# Patient Record
Sex: Female | Born: 1935 | Race: White | Hispanic: No | Smoking: Never smoker
Health system: Southern US, Community
[De-identification: ages and names within clinical notes are randomized; demographics above are authoritative.]

## PROBLEM LIST (undated history)

## (undated) DIAGNOSIS — F0390 Unspecified dementia without behavioral disturbance: Secondary | ICD-10-CM

## (undated) DIAGNOSIS — G20 Parkinson's disease: Secondary | ICD-10-CM

## (undated) DIAGNOSIS — G20A1 Parkinson's disease without dyskinesia, without mention of fluctuations: Secondary | ICD-10-CM

## (undated) DIAGNOSIS — F0280 Dementia in other diseases classified elsewhere without behavioral disturbance: Secondary | ICD-10-CM

## (undated) DIAGNOSIS — I350 Nonrheumatic aortic (valve) stenosis: Secondary | ICD-10-CM

## (undated) DIAGNOSIS — M1990 Unspecified osteoarthritis, unspecified site: Secondary | ICD-10-CM

## (undated) DIAGNOSIS — J9601 Acute respiratory failure with hypoxia: Secondary | ICD-10-CM

## (undated) DIAGNOSIS — E119 Type 2 diabetes mellitus without complications: Secondary | ICD-10-CM

## (undated) DIAGNOSIS — T8859XA Other complications of anesthesia, initial encounter: Secondary | ICD-10-CM

## (undated) DIAGNOSIS — T4145XA Adverse effect of unspecified anesthetic, initial encounter: Secondary | ICD-10-CM

## (undated) DIAGNOSIS — G309 Alzheimer's disease, unspecified: Secondary | ICD-10-CM

## (undated) DIAGNOSIS — R011 Cardiac murmur, unspecified: Secondary | ICD-10-CM

## (undated) DIAGNOSIS — C801 Malignant (primary) neoplasm, unspecified: Secondary | ICD-10-CM

## (undated) HISTORY — PX: EYE SURGERY: SHX253

## (undated) HISTORY — PX: ABDOMINAL HYSTERECTOMY: SHX81

## (undated) HISTORY — PX: FEMORAL ARTERY STENT: SHX1583

## (undated) HISTORY — PX: MASTECTOMY: SHX3

---

## 2011-01-02 ENCOUNTER — Emergency Department (HOSPITAL_COMMUNITY): Payer: Medicare (Managed Care)

## 2011-01-02 ENCOUNTER — Emergency Department (HOSPITAL_COMMUNITY)
Admission: EM | Admit: 2011-01-02 | Discharge: 2011-01-02 | Disposition: A | Discharge status: Home / Self Care | Payer: Medicare (Managed Care) | Attending: Emergency Medicine | Admitting: Emergency Medicine

## 2011-01-02 DIAGNOSIS — M549 Dorsalgia, unspecified: Secondary | ICD-10-CM | POA: Insufficient documentation

## 2011-01-02 DIAGNOSIS — F0280 Dementia in other diseases classified elsewhere without behavioral disturbance: Secondary | ICD-10-CM | POA: Insufficient documentation

## 2011-01-02 DIAGNOSIS — N390 Urinary tract infection, site not specified: Secondary | ICD-10-CM | POA: Insufficient documentation

## 2011-01-02 DIAGNOSIS — I359 Nonrheumatic aortic valve disorder, unspecified: Secondary | ICD-10-CM | POA: Insufficient documentation

## 2011-01-02 DIAGNOSIS — E119 Type 2 diabetes mellitus without complications: Secondary | ICD-10-CM | POA: Insufficient documentation

## 2011-01-02 DIAGNOSIS — R109 Unspecified abdominal pain: Secondary | ICD-10-CM | POA: Insufficient documentation

## 2011-01-02 DIAGNOSIS — G309 Alzheimer's disease, unspecified: Secondary | ICD-10-CM | POA: Insufficient documentation

## 2011-01-02 DIAGNOSIS — R197 Diarrhea, unspecified: Secondary | ICD-10-CM | POA: Insufficient documentation

## 2011-01-02 LAB — GLUCOSE, CAPILLARY: Glucose-Capillary: 197 mg/dL — ABNORMAL HIGH (ref 70–99)

## 2011-01-02 LAB — COMPREHENSIVE METABOLIC PANEL
ALT: 10 U/L (ref 0–35)
Calcium: 8.3 mg/dL — ABNORMAL LOW (ref 8.4–10.5)
GFR calc Af Amer: >60 mL/min (ref >60)
Glucose, Bld: 223 mg/dL — ABNORMAL HIGH (ref 70–99)
Sodium: 137 mEq/L (ref 135–145)
Total Protein: 5.2 g/dL — ABNORMAL LOW (ref 6.0–8.3)

## 2011-01-02 LAB — DIFFERENTIAL
Basophils Absolute: 0 10*3/uL (ref 0.0–0.1)
Eosinophils Absolute: 0 10*3/uL (ref 0.0–0.7)
Lymphocytes Relative: 30 % (ref 12–46)
Neutrophils Relative %: 49 % (ref 43–77)

## 2011-01-02 LAB — URINE MICROSCOPIC-ADD ON

## 2011-01-02 LAB — URINALYSIS, ROUTINE W REFLEX MICROSCOPIC
Nitrite: POSITIVE — AB
Specific Gravity, Urine: 1.031 — ABNORMAL HIGH (ref 1.005–1.030)
Urobilinogen, UA: 2 mg/dL — ABNORMAL HIGH (ref 0.0–1.0)

## 2011-01-02 LAB — LIPASE, BLOOD: Lipase: 23 U/L (ref 11–59)

## 2011-01-02 LAB — POCT I-STAT TROPONIN I: Troponin i, poc: 0.02 ng/mL (ref 0.00–0.08)

## 2011-01-02 LAB — CBC
MCH: 27.6 pg (ref 26.0–34.0)
Platelets: 106 10*3/uL — ABNORMAL LOW (ref 150–400)
RBC: 4.45 MIL/uL (ref 3.87–5.11)
WBC: 9 10*3/uL (ref 4.0–10.5)

## 2011-07-30 ENCOUNTER — Ambulatory Visit (INDEPENDENT_AMBULATORY_CARE_PROVIDER_SITE_OTHER): Payer: Medicare Other | Admitting: Family Medicine

## 2011-07-30 ENCOUNTER — Ambulatory Visit: Payer: Medicare Other

## 2011-07-30 VITALS — BP 111/68 | HR 77 | Temp 98.9°F | Resp 16

## 2011-07-30 DIAGNOSIS — W19XXXA Unspecified fall, initial encounter: Secondary | ICD-10-CM

## 2011-07-30 DIAGNOSIS — M25579 Pain in unspecified ankle and joints of unspecified foot: Secondary | ICD-10-CM

## 2011-07-30 DIAGNOSIS — M79673 Pain in unspecified foot: Secondary | ICD-10-CM

## 2011-07-30 DIAGNOSIS — M79609 Pain in unspecified limb: Secondary | ICD-10-CM

## 2011-07-30 NOTE — Progress Notes (Signed)
Patient Name: Emily Gibbs Date of Birth: 1931-11-21 Medical Record Number: [id number] Gender: female Date of Encounter: 07/30/2011  History of Present Illness:  Emily Gibbs is a 78 y.o. very pleasant female patient who presents with the following:  Here today to evaluate a possible left ankle or foot fracture. She fell last Thursday (8 days ago) and hurt her right shin- sustained a skin tear.  However more recently her family has noticed that he left anterior ankle looks swollen and bruised, and that her left lateral foot is bruised as well.  They believe she hurt her left foot at the same time she hurt her right  Emily Gibbs has dementia and uses a wheelchair for most mobility.  She does walk a bit for transfers.  Use uses a walker  They are treating her right anterior shin skin tears with silvadene cream and bandaging- she will be seen to follow-up this on monday  Moved from [place]- here today with her granddaughter.  Lives with her daughter- [name]- the mother of [name] who is here with her today.  Her PCP is still in [place]- but they are working on getting her a local PCP.  Normally [name] ambulates with a walker- however she is here today in her own wheelchair.  She also has a hospital bed and lift chair.  She has 24 hour care.    There is no problem list on file for this patient.  No past medical history on file. No past surgical history on file. History  Substance Use Topics  . Smoking status: Never Smoker   . Smokeless tobacco: Current User    Types: Snuff  . Alcohol Use: Not on file   No family history on file. No Known Allergies  Medication list has been reviewed and updated.  Review of Systems: As per HPI- otherwise negative.  Physical Examination: Filed Vitals:   07/30/11 1358  BP: 111/68  Pulse: 77  Temp: 98.9 F (37.2 C)  TempSrc: Oral  Resp: 16    There is no height or weight on file to calculate BMI.   GEN: WDWN, NAD, Non-toxic,  alert, in wheelchair HEENT: Atraumatic, Normocephalic.  RRR, lungs are clear bilaterally Ears and Nose: No external deformity. EXTR: No clubbing/cyanosis/edema NEURO: Normal gait.  PSYCH: Normally interactive. Conversant. Not depressed or anxious appearing.  Calm demeanor.  Left ankle: swelling, bruising and tenderness anteriorly.  There is also brusing and tenderness in the lateral foot.  She has tenderness over the entire foot and ankle- unable to pinpoint area of maximal tenderness. Looked at right anterior shin- skin tears are healing  [facility] reading (PRIMARY) by  Dr. Patsy Lager.  Degenerative change but no fracture noted  Findings:  Diffuse osteopenia. Subtle lucency through the fifth metatarsal head, worrisome for a nondisplaced fracture. Enthesopathic change seen at the base of the 5th metatarsal. Small plantar calcaneal spur. Enthesopathic change of the Achilles tendon insertion site. Vascular calcifications.  IMPRESSION: Lucency through the fifth metatarsal head and worrisome for a nondisplaced fracture. Correlation for point tenderness at this location is recommended.  Above findings discussed with Dr. Patsy Lager at [phone].   ------------------------------------------------------------------------- When compared with the lateral radiograph of the foot performed earlier same day, there is diffuse osteopenia without definite evidence of fracture. Ankle mortise is preserved. There is mild diffuse soft tissue swelling about the ankle. No ankle joint effusion. Mild degenerative change of the tibial talar joint. Enthesopathic change of the Achilles tendon insertion site. Small plantar calcaneal spur. Vascular calcifications.  IMPRESSION: Soft tissue swelling about the ankle without associated fracture.  Please refer to separate dictation from foot radiographs performed earlier same day.   Assessment and Plan: 1. Fall    2. Ankle pain  DG Ankle Complete Left, DG Foot Complete  Left  3. Foot pain  DG Ankle Complete Left, DG Foot Complete Left   [name] may have a foot fracture, but I am very concerned that a walking boot or cast will cause her to fall as she is frail and has poor balance.  Placed in a post- op shoe and encouraged her family to have her use her walker for any transfers and chair for distance.  She will come back and see [place] in about 3 days for a recheck- Sooner if worse.

## 2011-10-19 ENCOUNTER — Ambulatory Visit (HOSPITAL_COMMUNITY): Payer: Medicare Other

## 2012-01-04 ENCOUNTER — Telehealth: Payer: Self-pay

## 2012-01-04 NOTE — Telephone Encounter (Signed)
Would like to pick up copy of ankle xray please call [phone] when ready

## 2012-01-05 ENCOUNTER — Telehealth: Payer: Self-pay

## 2012-01-05 NOTE — Telephone Encounter (Signed)
[name] STATES HER MOM HAD CALLED THE OTHER DAY REQUESTING HER GRANDMOTHER'S XRAYS AND THEY WEREN'T READY, THEY REALLY NEED TO P/U ASAP PLEASE CALL [phone]

## 2012-01-05 NOTE — Telephone Encounter (Signed)
X-rays ready for pick up. Left message on machine. °

## 2012-01-05 NOTE — Telephone Encounter (Signed)
Have given to xray to get ready for patient.

## 2012-09-17 ENCOUNTER — Emergency Department (HOSPITAL_COMMUNITY)
Admission: EM | Admit: 2012-09-17 | Discharge: 2012-09-17 | Disposition: A | Discharge status: Home / Self Care | Payer: Medicare (Managed Care) | Attending: Emergency Medicine | Admitting: Emergency Medicine

## 2012-09-17 ENCOUNTER — Emergency Department (HOSPITAL_COMMUNITY): Payer: Medicare (Managed Care)

## 2012-09-17 DIAGNOSIS — E119 Type 2 diabetes mellitus without complications: Secondary | ICD-10-CM | POA: Insufficient documentation

## 2012-09-17 DIAGNOSIS — W1809XA Striking against other object with subsequent fall, initial encounter: Secondary | ICD-10-CM | POA: Insufficient documentation

## 2012-09-17 DIAGNOSIS — Y9389 Activity, other specified: Secondary | ICD-10-CM | POA: Insufficient documentation

## 2012-09-17 DIAGNOSIS — Y9289 Other specified places as the place of occurrence of the external cause: Secondary | ICD-10-CM | POA: Insufficient documentation

## 2012-09-17 DIAGNOSIS — S81009A Unspecified open wound, unspecified knee, initial encounter: Secondary | ICD-10-CM | POA: Insufficient documentation

## 2012-09-17 DIAGNOSIS — F0390 Unspecified dementia without behavioral disturbance: Secondary | ICD-10-CM | POA: Insufficient documentation

## 2012-09-17 DIAGNOSIS — Z794 Long term (current) use of insulin: Secondary | ICD-10-CM | POA: Insufficient documentation

## 2012-09-17 DIAGNOSIS — M869 Osteomyelitis, unspecified: Secondary | ICD-10-CM | POA: Insufficient documentation

## 2012-09-17 DIAGNOSIS — Z7902 Long term (current) use of antithrombotics/antiplatelets: Secondary | ICD-10-CM | POA: Insufficient documentation

## 2012-09-17 DIAGNOSIS — Z23 Encounter for immunization: Secondary | ICD-10-CM | POA: Insufficient documentation

## 2012-09-17 DIAGNOSIS — Z7982 Long term (current) use of aspirin: Secondary | ICD-10-CM | POA: Insufficient documentation

## 2012-09-17 DIAGNOSIS — S81819A Laceration without foreign body, unspecified lower leg, initial encounter: Secondary | ICD-10-CM

## 2012-09-17 DIAGNOSIS — Z79899 Other long term (current) drug therapy: Secondary | ICD-10-CM | POA: Insufficient documentation

## 2012-09-17 MED ORDER — CLINDAMYCIN HCL 300 MG PO CAPS
300.0000 mg | ORAL_CAPSULE | Freq: Once | ORAL | Status: AC
  Administered 2012-09-17: 300 mg via ORAL
  Filled 2012-09-17: qty 1

## 2012-09-17 MED ORDER — CLINDAMYCIN HCL 300 MG PO CAPS: 300.0000 mg | ORAL_CAPSULE | Freq: Three times a day (TID) | ORAL | Status: DC

## 2012-09-17 MED ORDER — TETANUS-DIPHTH-ACELL PERTUSSIS 5-2.5-18.5 LF-MCG/0.5 IM SUSP
0.5000 mL | Freq: Once | INTRAMUSCULAR | Status: AC
  Administered 2012-09-17: 0.5 mL via INTRAMUSCULAR
  Filled 2012-09-17: qty 0.5

## 2012-09-17 NOTE — ED Provider Notes (Signed)
History     CSN: [id number]  Arrival date & time 09/17/12  1505   First MD Initiated Contact with Patient 09/17/12 [phone]      Chief Complaint  Patient presents with  . Laceration    (Consider location/radiation/quality/duration/timing/severity/associated sxs/prior treatment) The history is provided by a relative. The history is limited by the condition of the patient.  Emily Gibbs is a 79 y.o. female history of dementia, DM here with fall. She just came back from [place] today and was trying to get in the door and her legs gave out and she hit her anterior shins on the steps. There is no head injury since the family was able to catch her in time. She then had some skin tears and was brought in for evaluation. Her tetanus was not up-to-date. History as per granddaughter as she is demented. She is on asa, plavix, but not coumadin.    Level V caveat- dementia   No past medical history on file.  No past surgical history on file.  No family history on file.  History  Substance Use Topics  . Smoking status: Never Smoker   . Smokeless tobacco: Current User    Types: Snuff  . Alcohol Use: Not on file    OB History   Grav Para Term Preterm Abortions TAB SAB Ect Mult Living                  Review of Systems  Unable to perform ROS: Dementia    Allergies  Review of patient's allergies indicates no known allergies.  Home Medications   Current Outpatient Rx  Name  Route  Sig  Dispense  Refill  . aspirin 81 MG tablet   Oral   Take 81 mg by mouth daily.         . citalopram (CELEXA) 10 MG tablet   Oral   Take 10 mg by mouth 3 (three) times daily.         . clopidogrel (PLAVIX) 75 MG tablet   Oral   Take 75 mg by mouth every morning.         . divalproex (DEPAKOTE) 500 MG DR tablet   Oral   Take 500 mg by mouth at bedtime.         . donepezil (ARICEPT) 10 MG tablet   Oral   Take 10 mg by mouth at bedtime.         Marland Kitchen esomeprazole (NEXIUM) 40 MG  capsule   Oral   Take 40 mg by mouth daily before breakfast.         . furosemide (LASIX) 20 MG tablet   Oral   Take 20 mg by mouth daily.         . insulin lispro (HUMALOG) 100 UNIT/ML injection   Subcutaneous   Inject 20 Units into the skin daily before breakfast.         . insulin lispro (HUMALOG) 100 UNIT/ML injection   Subcutaneous   Inject 100 Units into the skin daily before supper.         . memantine (NAMENDA) 10 MG tablet   Oral   Take 10 mg by mouth 2 (two) times daily.         . simvastatin (ZOCOR) 40 MG tablet   Oral   Take 40 mg by mouth every evening.         . traZODone (DESYREL) 150 MG tablet   Oral   Take 150 mg  by mouth at bedtime.         . vitamin E 1000 UNIT capsule   Oral   Take 1,000 Units by mouth daily.           BP 118/51  Temp(Src) 98.7 F (37.1 C) (Oral)  Resp 16  SpO2 93%  Physical Exam  Nursing note and vitals reviewed. Constitutional:  Demented, chronically ill, pleasant, NAD   HENT:  Head: Normocephalic.  Mouth/Throat: Oropharynx is clear and moist.  Eyes: Conjunctivae are normal. Pupils are equal, round, and reactive to light.  Neck: Normal range of motion. Neck supple.  Cardiovascular: Normal rate, regular rhythm and normal heart sounds.   Pulmonary/Chest: Effort normal and breath sounds normal. No respiratory distress. She has no wheezes. She has no rales.  Abdominal: Soft. Bowel sounds are normal. She exhibits no distension. There is no tenderness. There is no rebound and no guarding.  Musculoskeletal: Normal range of motion.  Neurological: She is alert.  Demented, moving all extremities   Skin:     Two skin tears on R tib/fib area with minimal bleeding. One large skin tear on L tib/fib area. Diminished pulses bilaterally. The R big toe appeared necrotic and some surrounding cellulitis.   Psychiatric:  Unable     ED Course  Procedures (including critical care time)  LACERATION REPAIR Performed by:  Chaney Malling Authorized by: Silverio Lay, Shawnda Mauney Consent: Verbal consent obtained. Risks and benefits: risks, benefits and alternatives were discussed Consent given by: patient Patient identity confirmed: provided demographic data Prepped and Draped in normal sterile fashion Wound explored  Laceration Location: bilateral shins  Laceration Length: 30 cm  No Foreign Bodies seen or palpated  Anesthesia: none  Local anesthetic: none  Irrigation method: syringe Amount of cleaning: standard  Skin closure: I debrided the dead skin and applied petroleum and then dry dressing   Patient tolerance: Patient tolerated the procedure well with no immediate complications.   Labs Reviewed - No data to display Dg Tibia/fibula Left  09/17/2012   *RADIOLOGY REPORT*  Clinical Data: Status post fall.  Lower leg lacerations.  LEFT TIBIA AND FIBULA - 2 VIEW  Comparison: Ankle radiographs 07/30/2011.  Findings: The bones are demineralized.  There is no evidence of acute fracture, dislocation or bone destruction.  There is some soft tissue irregularity anteriorly in the distal lower leg.  No foreign body is apparent.  There are diffuse vascular calcifications.  A distal femoral/popliteal stent graft is noted.  IMPRESSION: Lower leg soft tissue laceration without foreign body.  No acute osseous findings.   Original Report Authenticated By: Carey Bullocks, M.D.   Dg Tibia/fibula Right  09/17/2012   *RADIOLOGY REPORT*  Clinical Data: Fall today.  Lower leg lacerations.  RIGHT TIBIA AND FIBULA - 2 VIEW  Comparison: None.  Findings: The bones are demineralized.  There is no evidence of acute fracture, dislocation or bone destruction.  There is soft tissue irregularity anterior to the mid tibia consistent with a laceration.  No foreign body is identified.  Scattered vascular calcifications are noted.  There is a stent graft in the distal femoral artery.  IMPRESSION: Pretibial soft tissue injury.  No acute osseous findings or  evidence of foreign body.   Original Report Authenticated By: Carey Bullocks, M.D.   Dg Toe Great Right  09/17/2012   *RADIOLOGY REPORT*  Clinical Data: Black, necrotic right toe, evaluate for osteomyelitis  RIGHT GREAT TOE  Comparison: None.  Findings: Soft tissue swelling/irregularity overlying the distal first digit.  Cortical irregularity involving the first distal tuft, worrisome for osteomyelitis.  No fracture or dislocation is seen.  Degenerative changes of the first IP joint.  IMPRESSION: Cortical irregularity involving the first distal tuft, worrisome for osteomyelitis.   Original Report Authenticated By: Charline Bills, M.D.     No diagnosis found.    MDM  Emily Gibbs is a 78 y.o. female here with s/p fall with skin tears and R toe cellulitis. Will get xray to r/o foreign body and R toe osteomyelitis. The family knows about the T toe infection for awhile and wants to try and avoid amputation if possible.   5:28 PM Xray showed no foreign bodies. I debrided dead skin and applied petroleum gel and then dressing. Xray R toe showed possible osteo. As per family, she has this for a long time. She is not septic appearing and not hypotensive. They want to try PO abx for now and has podiatry f/u. Will d/c home on clinda. Recommend bactracin on wound and dressing changes. I told the family that the old skin will fall off by itself. Return precautions given.        Richardean Canal, MD 09/17/12 [phone]

## 2012-09-17 NOTE — ED Notes (Signed)
Per ems: pt was coming back from [facility] today with family, while getting into car pt slipped and obtained skin tares to bilat legs. Pt was heavily bleeding for 3 hour car ride. When pt returned home family called EMS. Hx of dementia, is on anticoagulants. bp 130/62, pulse 90, respirations 16, pain 8/10

## 2012-09-17 NOTE — ED Notes (Signed)
MD Yao at bedside 

## 2012-09-17 NOTE — ED Notes (Signed)
[id number]<JY> Expected date:<BR> Expected time:<BR> Means of arrival:<BR> Comments:<BR> Fall, uncontrolled bleeding

## 2012-10-13 ENCOUNTER — Emergency Department (HOSPITAL_COMMUNITY): Payer: Medicare (Managed Care)

## 2012-10-13 ENCOUNTER — Emergency Department (HOSPITAL_COMMUNITY)
Admission: EM | Admit: 2012-10-13 | Discharge: 2012-10-13 | Disposition: A | Discharge status: Home / Self Care | Payer: Medicare (Managed Care) | Attending: Emergency Medicine | Admitting: Emergency Medicine

## 2012-10-13 ENCOUNTER — Encounter (HOSPITAL_BASED_OUTPATIENT_CLINIC_OR_DEPARTMENT_OTHER): Payer: Medicare (Managed Care) | Attending: General Surgery

## 2012-10-13 ENCOUNTER — Encounter (HOSPITAL_COMMUNITY): Payer: Self-pay | Admitting: Family Medicine

## 2012-10-13 DIAGNOSIS — R011 Cardiac murmur, unspecified: Secondary | ICD-10-CM | POA: Insufficient documentation

## 2012-10-13 DIAGNOSIS — E119 Type 2 diabetes mellitus without complications: Secondary | ICD-10-CM | POA: Insufficient documentation

## 2012-10-13 DIAGNOSIS — Z79899 Other long term (current) drug therapy: Secondary | ICD-10-CM | POA: Insufficient documentation

## 2012-10-13 DIAGNOSIS — R5381 Other malaise: Secondary | ICD-10-CM | POA: Insufficient documentation

## 2012-10-13 DIAGNOSIS — Z7902 Long term (current) use of antithrombotics/antiplatelets: Secondary | ICD-10-CM | POA: Insufficient documentation

## 2012-10-13 DIAGNOSIS — F0390 Unspecified dementia without behavioral disturbance: Secondary | ICD-10-CM | POA: Insufficient documentation

## 2012-10-13 DIAGNOSIS — E1159 Type 2 diabetes mellitus with other circulatory complications: Secondary | ICD-10-CM | POA: Insufficient documentation

## 2012-10-13 DIAGNOSIS — F0280 Dementia in other diseases classified elsewhere without behavioral disturbance: Secondary | ICD-10-CM | POA: Insufficient documentation

## 2012-10-13 DIAGNOSIS — G20 Parkinson's disease: Secondary | ICD-10-CM | POA: Insufficient documentation

## 2012-10-13 DIAGNOSIS — Z8673 Personal history of transient ischemic attack (TIA), and cerebral infarction without residual deficits: Secondary | ICD-10-CM | POA: Insufficient documentation

## 2012-10-13 DIAGNOSIS — R4182 Altered mental status, unspecified: Secondary | ICD-10-CM | POA: Insufficient documentation

## 2012-10-13 DIAGNOSIS — M129 Arthropathy, unspecified: Secondary | ICD-10-CM | POA: Insufficient documentation

## 2012-10-13 DIAGNOSIS — E1169 Type 2 diabetes mellitus with other specified complication: Secondary | ICD-10-CM | POA: Insufficient documentation

## 2012-10-13 DIAGNOSIS — R531 Weakness: Secondary | ICD-10-CM

## 2012-10-13 DIAGNOSIS — I10 Essential (primary) hypertension: Secondary | ICD-10-CM | POA: Insufficient documentation

## 2012-10-13 DIAGNOSIS — L97809 Non-pressure chronic ulcer of other part of unspecified lower leg with unspecified severity: Secondary | ICD-10-CM | POA: Insufficient documentation

## 2012-10-13 DIAGNOSIS — G20A1 Parkinson's disease without dyskinesia, without mention of fluctuations: Secondary | ICD-10-CM | POA: Insufficient documentation

## 2012-10-13 DIAGNOSIS — I96 Gangrene, not elsewhere classified: Secondary | ICD-10-CM | POA: Insufficient documentation

## 2012-10-13 DIAGNOSIS — Z7982 Long term (current) use of aspirin: Secondary | ICD-10-CM | POA: Insufficient documentation

## 2012-10-13 DIAGNOSIS — L97509 Non-pressure chronic ulcer of other part of unspecified foot with unspecified severity: Secondary | ICD-10-CM | POA: Insufficient documentation

## 2012-10-13 DIAGNOSIS — Z8679 Personal history of other diseases of the circulatory system: Secondary | ICD-10-CM | POA: Insufficient documentation

## 2012-10-13 DIAGNOSIS — Z794 Long term (current) use of insulin: Secondary | ICD-10-CM | POA: Insufficient documentation

## 2012-10-13 DIAGNOSIS — Z853 Personal history of malignant neoplasm of breast: Secondary | ICD-10-CM | POA: Insufficient documentation

## 2012-10-13 DIAGNOSIS — G309 Alzheimer's disease, unspecified: Secondary | ICD-10-CM | POA: Insufficient documentation

## 2012-10-13 HISTORY — DX: Unspecified osteoarthritis, unspecified site: M19.90

## 2012-10-13 HISTORY — DX: Alzheimer's disease, unspecified: G30.9

## 2012-10-13 HISTORY — DX: Parkinson's disease without dyskinesia, without mention of fluctuations: G20.A1

## 2012-10-13 HISTORY — DX: Cardiac murmur, unspecified: R01.1

## 2012-10-13 HISTORY — DX: Dementia in other diseases classified elsewhere, unspecified severity, without behavioral disturbance, psychotic disturbance, mood disturbance, and anxiety: F02.80

## 2012-10-13 HISTORY — DX: Parkinson's disease: G20

## 2012-10-13 HISTORY — DX: Malignant (primary) neoplasm, unspecified: C80.1

## 2012-10-13 HISTORY — DX: Type 2 diabetes mellitus without complications: E11.9

## 2012-10-13 HISTORY — DX: Nonrheumatic aortic (valve) stenosis: I35.0

## 2012-10-13 HISTORY — DX: Unspecified dementia, unspecified severity, without behavioral disturbance, psychotic disturbance, mood disturbance, and anxiety: F03.90

## 2012-10-13 LAB — CBC
HCT: 33.7 % — ABNORMAL LOW (ref 36.0–46.0)
Platelets: 244 10*3/uL (ref 150–400)
RDW: 16 % — ABNORMAL HIGH (ref 11.5–15.5)
WBC: 9.3 10*3/uL (ref 4.0–10.5)

## 2012-10-13 LAB — COMPREHENSIVE METABOLIC PANEL
ALT: 5 U/L (ref 0–35)
AST: 10 U/L (ref 0–37)
Albumin: 2.5 g/dL — ABNORMAL LOW (ref 3.5–5.2)
Alkaline Phosphatase: 50 U/L (ref 39–117)
Chloride: 98 mEq/L (ref 96–112)
Potassium: 4.1 mEq/L (ref 3.5–5.1)
Sodium: 137 mEq/L (ref 135–145)
Total Bilirubin: 0.2 mg/dL — ABNORMAL LOW (ref 0.3–1.2)

## 2012-10-13 LAB — URINALYSIS, ROUTINE W REFLEX MICROSCOPIC
Bilirubin Urine: NEGATIVE
Hgb urine dipstick: NEGATIVE
Specific Gravity, Urine: 1.023 (ref 1.005–1.030)
pH: 6.5 (ref 5.0–8.0)

## 2012-10-13 MED ORDER — SODIUM CHLORIDE 0.9 % IV SOLN
Freq: Once | INTRAVENOUS | Status: AC
  Administered 2012-10-13: 18:00:00 via INTRAVENOUS

## 2012-10-13 NOTE — Progress Notes (Signed)
   CARE MANAGEMENT ED NOTE 10/13/2012  Patient:  GNFAOZH,[id number]   Account Number:  [id number]  Date Initiated:  10/13/2012  Documentation initiated by:  Radford Pax  Subjective/Objective Assessment:   Patient presented to ED with slurred speech and patient not interacting with family as much as per family.     Subjective/Objective Assessment Detail:     Action/Plan:   Action/Plan Detail:   Anticipated DC Date:  10/13/2012     Status Recommendation to Physician:   Result of Recommendation:    Other ED Services  Consult Working Plan    DC Planning Services  Other    Choice offered to / List presented to:            Status of service:  Completed, signed off  ED Comments:   ED Comments Detail:  EDCM spoke to patient's family at bedside as patient was sleeping.  Patient's grandaughters [name] and Didi at bedside.  Patient lives with patient's daughter [name] and Information systems manager Didi as per [employer].  [facility] was informed that patient already has [facility] which includes an Charity fundraiser and PT.  These services were set up by her pcp in [place].  no further needs at this time.

## 2012-10-13 NOTE — ED Notes (Signed)
Family states the past week pt has seen a lot of doctors and been traveling to [place] d/t injury of falling and hurting legs, family states they thought pt was acting different d/t the traveling and staying busy, the past week pt has been talking less, weaker and had food running out of mouth when trying to eat. Pt shakes head yes or no but not answering questions at this time. Pt not following commands such as lifting legs or smiling, did try and squeeze hands.

## 2012-10-13 NOTE — ED Provider Notes (Addendum)
Medical screening examination/treatment/procedure(s) were conducted as a shared visit with non-physician practitioner(s) and myself.  I personally evaluated the patient during the encounter Pt with some increased weakness, altered MS.  Hx of dementia.  No focal deficits on exam although limited due to her dementia.  Possible subacute stroke on CT.  Doubt admission would improve pt's overall status.  Family wants to take pt home which I think is ok.  Date: 10/14/2012  Rate: 85  Rhythm: normal sinus rhythm  QRS Axis: left  Intervals: normal  ST/T Wave abnormalities: nonspecific ST/T changes  Conduction Disutrbances:none  Narrative Interpretation:   Old EKG Reviewed: none available    Rolan Bucco, MD 10/13/12 1610  Rolan Bucco, MD 10/14/12 0028

## 2012-10-13 NOTE — ED Notes (Signed)
Patient's granddaughter states that on Wednesday she noticed that the patient was drooling a lot. Today, patient was noted to have slurred speech and not interacting with family as much as normal.

## 2012-10-13 NOTE — ED Provider Notes (Signed)
History    CSN: [id number] Arrival date & time 10/13/12  1554  First MD Initiated Contact with Patient 10/13/12 1708     Chief Complaint  Patient presents with  . Altered Mental Status  . Weakness   (Consider location/radiation/quality/duration/timing/severity/associated sxs/prior Treatment) Patient is a 79 y.o. female presenting with altered mental status and weakness. The history is provided by a relative and a caregiver. No language interpreter was used.  Altered Mental Status Presenting symptoms: behavior changes   Severity:  Moderate Associated symptoms comment:  The patient resides at home with daughter and granddaughter as caregivers. They reports that last week she had a choking episode while eating rice. Following this, she has become less verbal, has had drooling episodes and changed mental status from her baseline. She continues to be interested in eating and drinking. The caregivers deny any odor to urine or increased frequency. No vomiting. She has had a minimal cough. There is some belief in her having a fever last week but none since that time.  Weakness   Past Medical History  Diagnosis Date  . Diabetes mellitus without complication   . Arthritis   . Cancer     Breast   . Parkinson disease   . Alzheimer disease   . Dementia   . Aortic stenosis   . Heart murmur    Past Surgical History  Procedure Laterality Date  . Abdominal hysterectomy    . Mastectomy      Left  . Femoral artery stent     No family history on file. History  Substance Use Topics  . Smoking status: Never Smoker   . Smokeless tobacco: Current User    Types: Snuff  . Alcohol Use: No   OB History   Grav Para Term Preterm Abortions TAB SAB Ect Mult Living                 Review of Systems  Unable to perform ROS Psychiatric/Behavioral: Positive for altered mental status.    Allergies  Review of patient's allergies indicates no known allergies.  Home Medications   Current  Outpatient Rx  Name  Route  Sig  Dispense  Refill  . aspirin 81 MG tablet   Oral   Take 81 mg by mouth daily.         . cilostazol (PLETAL) 100 MG tablet   Oral   Take 100 mg by mouth 2 (two) times daily.         . citalopram (CELEXA) 10 MG tablet   Oral   Take 15 mg by mouth at bedtime.          . clopidogrel (PLAVIX) 75 MG tablet   Oral   Take 75 mg by mouth every morning.         . divalproex (DEPAKOTE) 500 MG DR tablet   Oral   Take 500 mg by mouth at bedtime.         . donepezil (ARICEPT) 10 MG tablet   Oral   Take 10 mg by mouth at bedtime.         Marland Kitchen esomeprazole (NEXIUM) 40 MG capsule   Oral   Take 40 mg by mouth daily before breakfast.         . fish oil-omega-3 fatty acids 1000 MG capsule   Oral   Take 2 g by mouth every morning.         . furosemide (LASIX) 20 MG tablet   Oral  Take 20 mg by mouth daily.         . insulin lispro (HUMALOG) 100 UNIT/ML injection   Subcutaneous   Inject 20 Units into the skin daily before breakfast.         . memantine (NAMENDA) 10 MG tablet   Oral   Take 10 mg by mouth 2 (two) times daily.         Marland Kitchen OVER THE COUNTER MEDICATION   Oral   Take 0.5 tablets by mouth daily as needed (allergy). Over the counter store brand ( [facility]) allergy medication         . simvastatin (ZOCOR) 40 MG tablet   Oral   Take 40 mg by mouth every evening.         . traZODone (DESYREL) 100 MG tablet   Oral   Take 150 mg by mouth at bedtime.         . vitamin E 1000 UNIT capsule   Oral   Take 1,000 Units by mouth daily.          BP 137/56  Pulse 92  Temp(Src) 98.5 F (36.9 C) (Oral)  Resp 18  Ht 5\' 2"  (1.575 m)  Wt 156 lb (70.761 kg)  BMI 28.53 kg/m2  SpO2 95% Physical Exam  Constitutional: She appears well-developed and well-nourished. No distress.  HENT:  Head: Normocephalic.  Mouth/Throat: Mucous membranes are dry.  Eyes: Conjunctivae are normal.  Chronic pupil deformity left eye secondary  to cataract surgery.  Cardiovascular: Normal rate and regular rhythm.   Murmur heard. Pulmonary/Chest: Effort normal. She has no wheezes. She has no rales.  Abdominal: Soft. She exhibits no mass. There is no tenderness.  Musculoskeletal: She exhibits no edema.  Neurological: She is alert.  Tracking movement. Follows some commands. Nonverbal. Remainder neurologic exam unable to assess.  Skin: Skin is warm and dry.    ED Course  Procedures (including critical care time) Labs Reviewed  GLUCOSE, CAPILLARY - Abnormal; Notable for the following:    Glucose-Capillary 303 (*)    All other components within normal limits  CBC - Abnormal; Notable for the following:    Hemoglobin 10.3 (*)    HCT 33.7 (*)    MCV 73.3 (*)    MCH 22.4 (*)    RDW 16.0 (*)    All other components within normal limits  COMPREHENSIVE METABOLIC PANEL - Abnormal; Notable for the following:    Glucose, Bld 300 (*)    Creatinine, Ser 0.49 (*)    Albumin 2.5 (*)    Total Bilirubin 0.2 (*)    All other components within normal limits  URINE CULTURE  PROTIME-INR  URINALYSIS, ROUTINE W REFLEX MICROSCOPIC   No results found. No diagnosis found. 1. Weakness 2. Dementia   MDM  Condition unchanged in ED. Labs reassuring, CT without acute change. Discussed results with family, all questions answered. They state they would prefer taking her home as opposed to admission and feel she is "just worn out". Discussed symptoms that would prompt return to ED.  Arnoldo Hooker, PA-C 10/13/12 2025

## 2012-10-14 LAB — URINE CULTURE: Colony Count: >=100000

## 2012-10-15 ENCOUNTER — Telehealth (HOSPITAL_COMMUNITY): Payer: Self-pay | Admitting: Emergency Medicine

## 2012-10-15 NOTE — ED Notes (Signed)
Post ED Visit - Positive Culture Follow-up  Culture report reviewed by antimicrobial stewardship pharmacist: []  Wes Dulaney, Pharm.D., BCPS []  Celedonio Miyamoto, Pharm.D., BCPS []  Georgina Pillion, [address].D., BCPS []  [place], [address].D., BCPS, AAHIVP []  Estella Husk, Pharm.D., BCPS, AAHIVP [x]  Okey Regal, Pharm.D., BCPS  Positive urine culture Likely contaminant, no treatment needed and no further patient follow-up is required at this time.  Kylie A Holland 10/15/2012, 5:54 PM

## 2012-10-16 NOTE — Progress Notes (Signed)
[facility]  NAME:  [name], [name] NO.:  MEDICAL RECORD NO.:  [id number]      DATE OF BIRTH:  19-Feb-1932  PHYSICIAN:  Ardath Sax, M.D.           VISIT DATE:                                  OFFICE VISIT   Emily Gibbs is a 79 year old lady who was sent to [place] because of a gangrenous right great toe, with also I am going to say Wagner 2 and 3 ulcers on the right leg.  This lady is senile but she does live at home with her family, is able to take care of her.  She is a diabetic, has hypertension.  She is on many medicines including Lasix and trazodone, Ativan, Plavix 75 mg a day, Pletal 100 mg a day, and they have recently put her on Cipro for her toe.  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  She has type 2 diabetes and peripheral vascular disease.  On exam today, she has a blood pressure of 134/76, respirations 18, pulse 100, temperature 98.  She weighs 155 pounds.  On examination, she has multiple Wagner 2 and 3 ulcers on the anterior and lateral aspect of her right leg and has a frank gangrenous right great toe in the distal phalanx.  I debrided some necrotic skin and we will treat this with Santyl.  We will wrap her leg with a Kerlix and will put silver alginate on these ulcers on her legs.  She will come back in a week for dressing change.  So, her diagnosis is dementia, type 2 diabetes, hypertension, history of a stroke, Wagner 2 and 3, diabetic ulcers on her right leg and right great toe distal phalanx.     Ardath Sax, M.D.     PP/MEDQ  D:  10/13/2012  T:  10/14/2012  Job:  [id number]

## 2012-10-19 ENCOUNTER — Inpatient Hospital Stay (HOSPITAL_COMMUNITY)
Admission: EM | Admit: 2012-10-19 | Discharge: 2012-10-27 | DRG: 240 | Disposition: A | Discharge status: Skilled Nursing Facility | Payer: Medicare (Managed Care) | Attending: Internal Medicine | Admitting: Internal Medicine

## 2012-10-19 ENCOUNTER — Encounter (HOSPITAL_COMMUNITY): Payer: Self-pay | Admitting: Family Medicine

## 2012-10-19 ENCOUNTER — Emergency Department (HOSPITAL_COMMUNITY): Payer: Medicare (Managed Care)

## 2012-10-19 DIAGNOSIS — Z7982 Long term (current) use of aspirin: Secondary | ICD-10-CM

## 2012-10-19 DIAGNOSIS — K219 Gastro-esophageal reflux disease without esophagitis: Secondary | ICD-10-CM | POA: Diagnosis present

## 2012-10-19 DIAGNOSIS — L03031 Cellulitis of right toe: Secondary | ICD-10-CM | POA: Diagnosis present

## 2012-10-19 DIAGNOSIS — E1159 Type 2 diabetes mellitus with other circulatory complications: Secondary | ICD-10-CM

## 2012-10-19 DIAGNOSIS — Z79899 Other long term (current) drug therapy: Secondary | ICD-10-CM

## 2012-10-19 DIAGNOSIS — I96 Gangrene, not elsewhere classified: Secondary | ICD-10-CM | POA: Diagnosis present

## 2012-10-19 DIAGNOSIS — D638 Anemia in other chronic diseases classified elsewhere: Secondary | ICD-10-CM | POA: Diagnosis present

## 2012-10-19 DIAGNOSIS — E1151 Type 2 diabetes mellitus with diabetic peripheral angiopathy without gangrene: Secondary | ICD-10-CM | POA: Diagnosis present

## 2012-10-19 DIAGNOSIS — E1142 Type 2 diabetes mellitus with diabetic polyneuropathy: Secondary | ICD-10-CM | POA: Diagnosis present

## 2012-10-19 DIAGNOSIS — Z66 Do not resuscitate: Secondary | ICD-10-CM | POA: Diagnosis present

## 2012-10-19 DIAGNOSIS — F0280 Dementia in other diseases classified elsewhere without behavioral disturbance: Secondary | ICD-10-CM | POA: Diagnosis present

## 2012-10-19 DIAGNOSIS — Z7401 Bed confinement status: Secondary | ICD-10-CM

## 2012-10-19 DIAGNOSIS — G20A1 Parkinson's disease without dyskinesia, without mention of fluctuations: Secondary | ICD-10-CM | POA: Diagnosis present

## 2012-10-19 DIAGNOSIS — I359 Nonrheumatic aortic valve disorder, unspecified: Secondary | ICD-10-CM | POA: Diagnosis present

## 2012-10-19 DIAGNOSIS — E1149 Type 2 diabetes mellitus with other diabetic neurological complication: Secondary | ICD-10-CM | POA: Diagnosis present

## 2012-10-19 DIAGNOSIS — I798 Other disorders of arteries, arterioles and capillaries in diseases classified elsewhere: Secondary | ICD-10-CM

## 2012-10-19 DIAGNOSIS — L03119 Cellulitis of unspecified part of limb: Secondary | ICD-10-CM | POA: Diagnosis present

## 2012-10-19 DIAGNOSIS — L02619 Cutaneous abscess of unspecified foot: Secondary | ICD-10-CM | POA: Diagnosis present

## 2012-10-19 DIAGNOSIS — D62 Acute posthemorrhagic anemia: Secondary | ICD-10-CM | POA: Diagnosis not present

## 2012-10-19 DIAGNOSIS — L089 Local infection of the skin and subcutaneous tissue, unspecified: Secondary | ICD-10-CM

## 2012-10-19 DIAGNOSIS — Z853 Personal history of malignant neoplasm of breast: Secondary | ICD-10-CM

## 2012-10-19 DIAGNOSIS — R5381 Other malaise: Secondary | ICD-10-CM | POA: Diagnosis not present

## 2012-10-19 DIAGNOSIS — E1165 Type 2 diabetes mellitus with hyperglycemia: Secondary | ICD-10-CM | POA: Diagnosis present

## 2012-10-19 DIAGNOSIS — G20 Parkinson's disease: Secondary | ICD-10-CM | POA: Diagnosis present

## 2012-10-19 DIAGNOSIS — I739 Peripheral vascular disease, unspecified: Secondary | ICD-10-CM | POA: Diagnosis present

## 2012-10-19 DIAGNOSIS — I70269 Atherosclerosis of native arteries of extremities with gangrene, unspecified extremity: Principal | ICD-10-CM | POA: Diagnosis present

## 2012-10-19 DIAGNOSIS — F0391 Unspecified dementia with behavioral disturbance: Secondary | ICD-10-CM

## 2012-10-19 DIAGNOSIS — Z89619 Acquired absence of unspecified leg above knee: Secondary | ICD-10-CM

## 2012-10-19 DIAGNOSIS — F0390 Unspecified dementia without behavioral disturbance: Secondary | ICD-10-CM | POA: Diagnosis present

## 2012-10-19 DIAGNOSIS — IMO0002 Reserved for concepts with insufficient information to code with codable children: Secondary | ICD-10-CM | POA: Diagnosis present

## 2012-10-19 DIAGNOSIS — G309 Alzheimer's disease, unspecified: Secondary | ICD-10-CM | POA: Diagnosis present

## 2012-10-19 DIAGNOSIS — L03032 Cellulitis of left toe: Secondary | ICD-10-CM

## 2012-10-19 DIAGNOSIS — Z794 Long term (current) use of insulin: Secondary | ICD-10-CM

## 2012-10-19 LAB — POTASSIUM: Potassium: 4 mEq/L (ref 3.5–5.1)

## 2012-10-19 LAB — CBC WITH DIFFERENTIAL/PLATELET
Basophils Absolute: 0 10*3/uL (ref 0.0–0.1)
Basophils Relative: 0 % (ref 0–1)
HCT: 35.4 % — ABNORMAL LOW (ref 36.0–46.0)
Hemoglobin: 10.9 g/dL — ABNORMAL LOW (ref 12.0–15.0)
Lymphs Abs: 2.8 10*3/uL (ref 0.7–4.0)
MCHC: 30.8 g/dL (ref 30.0–36.0)
MCV: 73.6 fL — ABNORMAL LOW (ref 78.0–100.0)
Monocytes Relative: 11 % (ref 3–12)
Neutro Abs: 6.8 10*3/uL (ref 1.7–7.7)
RDW: 15.7 % — ABNORMAL HIGH (ref 11.5–15.5)
WBC: 11 10*3/uL — ABNORMAL HIGH (ref 4.0–10.5)

## 2012-10-19 LAB — BASIC METABOLIC PANEL
BUN: 8 mg/dL (ref 6–23)
CO2: 33 mEq/L — ABNORMAL HIGH (ref 19–32)
Chloride: 100 mEq/L (ref 96–112)
Creatinine, Ser: 0.51 mg/dL (ref 0.50–1.10)
GFR calc Af Amer: >90 mL/min (ref >90)
Glucose, Bld: 265 mg/dL — ABNORMAL HIGH (ref 70–99)

## 2012-10-19 LAB — CBC
HCT: 37.8 % (ref 36.0–46.0)
MCHC: 31 g/dL (ref 30.0–36.0)
MCV: 73 fL — ABNORMAL LOW (ref 78.0–100.0)
Platelets: 196 10*3/uL (ref 150–400)
RDW: 15.8 % — ABNORMAL HIGH (ref 11.5–15.5)
WBC: 11.5 10*3/uL — ABNORMAL HIGH (ref 4.0–10.5)

## 2012-10-19 LAB — CREATININE, SERUM
Creatinine, Ser: 0.48 mg/dL — ABNORMAL LOW (ref 0.50–1.10)
GFR calc Af Amer: >90 mL/min (ref >90)
GFR calc non Af Amer: >90 mL/min (ref >90)

## 2012-10-19 LAB — GLUCOSE, CAPILLARY: Glucose-Capillary: 288 mg/dL — ABNORMAL HIGH (ref 70–99)

## 2012-10-19 MED ORDER — MEMANTINE HCL 10 MG PO TABS
10.0000 mg | ORAL_TABLET | Freq: Two times a day (BID) | ORAL | Status: DC
  Administered 2012-10-19 – 2012-10-27 (×14): 10 mg via ORAL
  Filled 2012-10-19 (×17): qty 1

## 2012-10-19 MED ORDER — SODIUM CHLORIDE 0.9 % IJ SOLN: 3.0000 mL | INTRAMUSCULAR | Status: DC | PRN

## 2012-10-19 MED ORDER — ONDANSETRON HCL 4 MG PO TABS
4.0000 mg | ORAL_TABLET | Freq: Four times a day (QID) | ORAL | Status: DC | PRN
  Administered 2012-10-22: 4 mg via ORAL
  Filled 2012-10-19: qty 1

## 2012-10-19 MED ORDER — ENOXAPARIN SODIUM 30 MG/0.3ML ~~LOC~~ SOLN
30.0000 mg | SUBCUTANEOUS | Status: DC
  Administered 2012-10-19: 30 mg via SUBCUTANEOUS
  Filled 2012-10-19 (×2): qty 0.3

## 2012-10-19 MED ORDER — INSULIN ASPART 100 UNIT/ML ~~LOC~~ SOLN
0.0000 [IU] | Freq: Every day | SUBCUTANEOUS | Status: DC
  Administered 2012-10-19: 3 [IU] via SUBCUTANEOUS
  Administered 2012-10-20: 2 [IU] via SUBCUTANEOUS
  Administered 2012-10-21: 3 [IU] via SUBCUTANEOUS
  Administered 2012-10-22: 4 [IU] via SUBCUTANEOUS
  Administered 2012-10-23 – 2012-10-24 (×2): 3 [IU] via SUBCUTANEOUS
  Administered 2012-10-25: 2 [IU] via SUBCUTANEOUS

## 2012-10-19 MED ORDER — CLOPIDOGREL BISULFATE 75 MG PO TABS
75.0000 mg | ORAL_TABLET | Freq: Every morning | ORAL | Status: DC
  Administered 2012-10-20 – 2012-10-25 (×5): 75 mg via ORAL
  Filled 2012-10-19 (×7): qty 1

## 2012-10-19 MED ORDER — ASPIRIN 81 MG PO CHEW
81.0000 mg | CHEWABLE_TABLET | Freq: Every day | ORAL | Status: DC
  Administered 2012-10-20 – 2012-10-27 (×7): 81 mg via ORAL
  Filled 2012-10-19 (×8): qty 1

## 2012-10-19 MED ORDER — SIMVASTATIN 40 MG PO TABS
40.0000 mg | ORAL_TABLET | Freq: Every evening | ORAL | Status: DC
  Administered 2012-10-19 – 2012-10-27 (×8): 40 mg via ORAL
  Filled 2012-10-19 (×9): qty 1

## 2012-10-19 MED ORDER — ACETAMINOPHEN 325 MG PO TABS
650.0000 mg | ORAL_TABLET | Freq: Four times a day (QID) | ORAL | Status: DC | PRN
  Administered 2012-10-21 – 2012-10-22 (×3): 650 mg via ORAL
  Filled 2012-10-19 (×3): qty 2

## 2012-10-19 MED ORDER — OXYCODONE HCL 5 MG PO TABS
5.0000 mg | ORAL_TABLET | ORAL | Status: DC | PRN
  Administered 2012-10-24: 5 mg via ORAL
  Filled 2012-10-19: qty 1

## 2012-10-19 MED ORDER — CLINDAMYCIN HCL 300 MG PO CAPS
300.0000 mg | ORAL_CAPSULE | Freq: Once | ORAL | Status: AC
  Administered 2012-10-19: 300 mg via ORAL
  Filled 2012-10-19: qty 1

## 2012-10-19 MED ORDER — ASPIRIN 81 MG PO TABS: 81.0000 mg | ORAL_TABLET | Freq: Every day | ORAL | Status: DC

## 2012-10-19 MED ORDER — TRAZODONE HCL 150 MG PO TABS
150.0000 mg | ORAL_TABLET | Freq: Every day | ORAL | Status: DC
  Administered 2012-10-19 – 2012-10-26 (×7): 150 mg via ORAL
  Filled 2012-10-19 (×9): qty 1

## 2012-10-19 MED ORDER — PIPERACILLIN-TAZOBACTAM 3.375 G IVPB
3.3750 g | Freq: Once | INTRAVENOUS | Status: AC
  Administered 2012-10-19: 3.375 g via INTRAVENOUS
  Filled 2012-10-19: qty 50

## 2012-10-19 MED ORDER — INSULIN ASPART 100 UNIT/ML ~~LOC~~ SOLN
0.0000 [IU] | Freq: Three times a day (TID) | SUBCUTANEOUS | Status: DC
  Administered 2012-10-20: 8 [IU] via SUBCUTANEOUS
  Administered 2012-10-20: 3 [IU] via SUBCUTANEOUS
  Administered 2012-10-20: 5 [IU] via SUBCUTANEOUS
  Administered 2012-10-21: 11 [IU] via SUBCUTANEOUS
  Administered 2012-10-21: 8 [IU] via SUBCUTANEOUS
  Administered 2012-10-21 – 2012-10-22 (×2): 5 [IU] via SUBCUTANEOUS
  Administered 2012-10-22: 12:00:00 via SUBCUTANEOUS
  Administered 2012-10-22 – 2012-10-23 (×2): 8 [IU] via SUBCUTANEOUS
  Administered 2012-10-23: 3 [IU] via SUBCUTANEOUS

## 2012-10-19 MED ORDER — FUROSEMIDE 20 MG PO TABS
20.0000 mg | ORAL_TABLET | Freq: Every day | ORAL | Status: DC
  Administered 2012-10-20 – 2012-10-27 (×7): 20 mg via ORAL
  Filled 2012-10-19 (×9): qty 1

## 2012-10-19 MED ORDER — SODIUM CHLORIDE 0.9 % IV SOLN: 250.0000 mL | INTRAVENOUS | Status: DC | PRN

## 2012-10-19 MED ORDER — CILOSTAZOL 100 MG PO TABS
100.0000 mg | ORAL_TABLET | Freq: Two times a day (BID) | ORAL | Status: DC
  Administered 2012-10-19 – 2012-10-25 (×10): 100 mg via ORAL
  Filled 2012-10-19 (×13): qty 1

## 2012-10-19 MED ORDER — DIVALPROEX SODIUM 500 MG PO DR TAB
500.0000 mg | DELAYED_RELEASE_TABLET | Freq: Every day | ORAL | Status: DC
  Administered 2012-10-19 – 2012-10-26 (×7): 500 mg via ORAL
  Filled 2012-10-19 (×9): qty 1

## 2012-10-19 MED ORDER — ACETAMINOPHEN 650 MG RE SUPP: 650.0000 mg | Freq: Four times a day (QID) | RECTAL | Status: DC | PRN

## 2012-10-19 MED ORDER — PANTOPRAZOLE SODIUM 40 MG PO TBEC
80.0000 mg | DELAYED_RELEASE_TABLET | Freq: Every day | ORAL | Status: DC
  Administered 2012-10-20 – 2012-10-27 (×7): 80 mg via ORAL
  Filled 2012-10-19: qty 2
  Filled 2012-10-19: qty 1
  Filled 2012-10-19 (×2): qty 2
  Filled 2012-10-19: qty 1
  Filled 2012-10-19 (×3): qty 2

## 2012-10-19 MED ORDER — DONEPEZIL HCL 10 MG PO TABS
10.0000 mg | ORAL_TABLET | Freq: Every day | ORAL | Status: DC
  Administered 2012-10-19 – 2012-10-26 (×7): 10 mg via ORAL
  Filled 2012-10-19 (×9): qty 1

## 2012-10-19 MED ORDER — ONDANSETRON HCL 4 MG/2ML IJ SOLN: 4.0000 mg | Freq: Three times a day (TID) | INTRAMUSCULAR | Status: AC | PRN

## 2012-10-19 MED ORDER — SODIUM CHLORIDE 0.9 % IJ SOLN
3.0000 mL | Freq: Two times a day (BID) | INTRAMUSCULAR | Status: DC
  Administered 2012-10-19 – 2012-10-26 (×6): 3 mL via INTRAVENOUS

## 2012-10-19 MED ORDER — ONDANSETRON HCL 4 MG/2ML IJ SOLN: 4.0000 mg | Freq: Four times a day (QID) | INTRAMUSCULAR | Status: DC | PRN

## 2012-10-19 MED ORDER — CITALOPRAM HYDROBROMIDE 10 MG PO TABS
15.0000 mg | ORAL_TABLET | Freq: Every day | ORAL | Status: DC
  Administered 2012-10-19 – 2012-10-26 (×7): 15 mg via ORAL
  Filled 2012-10-19 (×9): qty 2

## 2012-10-19 MED ORDER — POTASSIUM CHLORIDE 20 MEQ/15ML (10%) PO LIQD
40.0000 meq | Freq: Once | ORAL | Status: AC
  Administered 2012-10-19: 40 meq via ORAL
  Filled 2012-10-19: qty 30

## 2012-10-19 NOTE — ED Notes (Signed)
Per family, pt is currently being treated for infection to right foot and the infection is spread. sts the area is green and oozing.

## 2012-10-19 NOTE — ED Notes (Addendum)
Pt was seen on Friday at wound center for a bruise on her rt great toe that was not healing properly. Per family, Dr at wound center cut into the foot an since then has gotten worse and become necrotic.

## 2012-10-19 NOTE — ED Notes (Signed)
Attempted IV. No success. IV team paged

## 2012-10-19 NOTE — ED Provider Notes (Signed)
History    CSN: [id number] Arrival date & time 10/19/12  1130  First MD Initiated Contact with Patient 10/19/12 1142     Chief Complaint  Patient presents with  . Wound Infection   (Consider location/radiation/quality/duration/timing/severity/associated sxs/prior Treatment) HPI Comments: Patient history of diabetes, dry gangrene of right great toe -- presents with family member who has noted increased amount of green drainage from the toe since the wound care appointment 6 days ago. Patient had recently been seen at wound care in [place] where the patient is from but has recently established care [place]. She also has seen her vascular surgeon in [place] and states that she has some blockages but no critical blockages in her leg. Patient has not had any fever, nausea or vomiting. She recently completed a course of clindamycin that she was on 2/2 skin infections of her shins bilaterally. Overall, she has been a gradual decline in her daughter. The onset of this condition was acute. The course is constant. Aggravating factors: none. Alleviating factors: none.    The history is provided by a relative.   Past Medical History  Diagnosis Date  . Diabetes mellitus without complication   . Arthritis   . Parkinson disease   . Alzheimer disease   . Dementia   . Aortic stenosis   . Heart murmur   . Cancer     Breast    Past Surgical History  Procedure Laterality Date  . Abdominal hysterectomy    . Mastectomy      Left  . Femoral artery stent     History reviewed. No pertinent family history. History  Substance Use Topics  . Smoking status: Never Smoker   . Smokeless tobacco: Current User    Types: Snuff  . Alcohol Use: No   OB History   Grav Para Term Preterm Abortions TAB SAB Ect Mult Living                 Review of Systems  Constitutional: Negative for fever.  HENT: Negative for sore throat and rhinorrhea.   Eyes: Negative for redness.  Respiratory: Negative  for cough.   Cardiovascular: Negative for chest pain.  Gastrointestinal: Negative for nausea, vomiting, abdominal pain and diarrhea.  Genitourinary: Negative for dysuria.  Musculoskeletal: Negative for myalgias.  Skin: Positive for color change and wound. Negative for rash.  Neurological: Negative for headaches.    Allergies  Review of patient's allergies indicates no known allergies.  Home Medications   Current Outpatient Rx  Name  Route  Sig  Dispense  Refill  . aspirin 81 MG tablet   Oral   Take 81 mg by mouth daily.         . cilostazol (PLETAL) 100 MG tablet   Oral   Take 100 mg by mouth 2 (two) times daily.         . citalopram (CELEXA) 10 MG tablet   Oral   Take 15 mg by mouth at bedtime.          . clopidogrel (PLAVIX) 75 MG tablet   Oral   Take 75 mg by mouth every morning.         . divalproex (DEPAKOTE) 500 MG DR tablet   Oral   Take 500 mg by mouth at bedtime.         . donepezil (ARICEPT) 10 MG tablet   Oral   Take 10 mg by mouth at bedtime.         Marland Kitchen  esomeprazole (NEXIUM) 40 MG capsule   Oral   Take 40 mg by mouth daily before breakfast.         . fish oil-omega-3 fatty acids 1000 MG capsule   Oral   Take 2 g by mouth every morning.         . furosemide (LASIX) 20 MG tablet   Oral   Take 20 mg by mouth daily.         . insulin lispro (HUMALOG) 100 UNIT/ML injection   Subcutaneous   Inject 20 Units into the skin daily before breakfast.         . memantine (NAMENDA) 10 MG tablet   Oral   Take 10 mg by mouth 2 (two) times daily.         Marland Kitchen OVER THE COUNTER MEDICATION   Oral   Take 0.5 tablets by mouth daily as needed (allergy). Over the counter store brand ( [facility]) allergy medication         . simvastatin (ZOCOR) 40 MG tablet   Oral   Take 40 mg by mouth every evening.         . traZODone (DESYREL) 100 MG tablet   Oral   Take 150 mg by mouth at bedtime.         . vitamin E 1000 UNIT capsule   Oral    Take 1,000 Units by mouth daily.          BP 134/60  Pulse 90  Temp(Src) 98.3 F (36.8 C)  Resp 16  SpO2 96% Physical Exam  Nursing note and vitals reviewed. Constitutional: She appears well-developed and well-nourished.  HENT:  Head: Normocephalic and atraumatic.  Eyes: Conjunctivae are normal. Right eye exhibits no discharge. Left eye exhibits no discharge.  Neck: Normal range of motion. Neck supple.  Cardiovascular: Normal rate and regular rhythm.   Murmur heard.  Systolic murmur is present with a grade of 4/6  Pulses:      Dorsalis pedis pulses are 1+ on the right side, and 1+ on the left side.  Pulmonary/Chest: Effort normal and breath sounds normal.  Abdominal: Soft. There is no tenderness.  Neurological: She is alert.  Skin: Skin is warm and dry.  Necrotic R great toe with mild oozing noted and on bandage. There are healing wounds on anterior shins bilaterally with small area of oozing R shin inferiorly.   Psychiatric: She has a normal mood and affect.    ED Course  Procedures (including critical care time) Labs Reviewed  CBC WITH DIFFERENTIAL - Abnormal; Notable for the following:    WBC 11.0 (*)    Hemoglobin 10.9 (*)    HCT 35.4 (*)    MCV 73.6 (*)    MCH 22.7 (*)    RDW 15.7 (*)    Monocytes Absolute 1.2 (*)    All other components within normal limits  BASIC METABOLIC PANEL - Abnormal; Notable for the following:    Potassium 2.9 (*)    CO2 33 (*)    Glucose, Bld 265 (*)    Calcium 8.2 (*)    GFR calc non Af Amer 89 (*)    All other components within normal limits  POCT I-STAT TROPONIN I   Dg Foot Complete Right  10/19/2012   *RADIOLOGY REPORT*  Clinical Data: Wound infection  RIGHT FOOT COMPLETE - 3+ VIEW  Comparison: 09/17/2012  Findings: Three views of the right foot submitted.  Again noted diffuse osteopenia.  No acute fracture  or subluxation.  Again note cortical irregularity at the tip of distal phalanx great toe. Osteomyelitis cannot be excluded.   Clinical correlation is necessary.  IMPRESSION: No acute fracture or subluxation.  Again note cortical irregularity at the tip of distal phalanx great toe.  Osteomyelitis cannot be excluded.  Clinical correlation is necessary.   Original Report Authenticated By: Natasha Mead, M.D.   1. Gangrene of toe   2. Infection of toe     12:02 PM Patient seen and examined. Work-up initiated. Medications ordered. D/w Dr. Rubin Payor who has seen.   Vital signs reviewed and are as follows: Filed Vitals:   10/19/12 1140  BP: 134/60  Pulse: 90  Temp: 98.3 F (36.8 C)  Resp: 16   2:34 PM Results reviewed with family. Will admit patient.   Spoke with Dr. Rito Ehrlich who will see and admit.    MDM  Admit for gangrene/infection of toe, worsening, possible osteo, possible amputation.   Renne Crigler, PA-C 10/19/12 1435

## 2012-10-19 NOTE — Progress Notes (Signed)
Pt needs IV restart. IV was placed in arm with a previous mastectomy. Calling IV team now.

## 2012-10-19 NOTE — ED Notes (Addendum)
Pt transported to floor by tech. Family at bedside. Called the floor to tell RN patient on her way.

## 2012-10-19 NOTE — ED Notes (Signed)
Pt urinated in bed. Pt cleaned up by RN and family. Pt wiped down with soap and water. Put on a clean underpad.

## 2012-10-19 NOTE — H&P (Signed)
[facility] History and Physical  Emily Gibbs ONG:[id number] DOB: April 06, 1932 DOA: 10/19/2012  Referring physician: Rhea Bleacher, ER PA PCP: Patient has primary care physicians in [facility] Specialists: None  Chief Complaint: Infected toe  HPI: Emily Gibbs is a 79 y.o. female  With past history of diabetes mellitus, peripheral vascular disease and Alzheimer's dementia who has been residing with her daughter and occasionally takes trips down to [place] to visit her other daughter and see her physicians down there. Prior to June 1, the patient had a small wound on the distal aspect of her left great toe which as advised by wound care was small and they recommended watching and treating to keep it dry. Wound is felt to be dry gangrene and would auto amputate. Patient spent approximately 2-3 weeks then with her other daughter in [place]. According to the daughter here, the patient at that time spent most of it in bed. When the daughter [place] saw her mother approximately 10-14 days ago, she noted that the dry gangrenous area had become swollen and blackened extending further down the toe. She took her mother to a podiatrist here in [place] who debrided the lesion. Following debridement, the wound has since had some moderate amount of drainage and has also started having secondary discoloration extending past the toe into the foot. Family was concerned and was advised to bring patient emergency room today. The emergency room, patient was noted have mild white count of 11 elevated blood sugars around 300 and an x-ray which could not rule out osteomyelitis. Hospitalists were called for further evaluation and admission.  Review of Systems: The patient's dementia, I'm unable to get any kind of review of systems.  Patient's daughter tells me that in the last month, the patient has had less of an appetite, is a little less responsive and has spent most of her time in  bed.  Past Medical History  Diagnosis Date  . Diabetes mellitus without complication   . Arthritis   . Parkinson disease   . Alzheimer disease   . Dementia   . Aortic stenosis   . Heart murmur   . Cancer     Breast    Past Surgical History  Procedure Laterality Date  . Abdominal hysterectomy    . Mastectomy      Left  . Femoral artery stent     Social History:  reports that she has never smoked. Her smokeless tobacco use includes Snuff. She reports that she does not drink alcohol or use illicit drugs. Patient normally lives with her daughter in [place]. She is able to ambulate with use of a walker or family help. In the last 1-2 weeks, she has been more bed bound  No Known Allergies  Family history: Hypertension as per daughter  Prior to Admission medications   Medication Sig Start Date End Date Taking? Authorizing Provider  aspirin 81 MG tablet Take 81 mg by mouth daily.   Yes Historical Provider, MD  cilostazol (PLETAL) 100 MG tablet Take 100 mg by mouth 2 (two) times daily.   Yes Historical Provider, MD  citalopram (CELEXA) 10 MG tablet Take 15 mg by mouth at bedtime.    Yes Historical Provider, MD  clopidogrel (PLAVIX) 75 MG tablet Take 75 mg by mouth every morning.   Yes Historical Provider, MD  divalproex (DEPAKOTE) 500 MG DR tablet Take 500 mg by mouth at bedtime.   Yes Historical Provider, MD  donepezil (ARICEPT) 10 MG tablet  Take 10 mg by mouth at bedtime.   Yes Historical Provider, MD  esomeprazole (NEXIUM) 40 MG capsule Take 40 mg by mouth daily before breakfast.   Yes Historical Provider, MD  fish oil-omega-3 fatty acids 1000 MG capsule Take 2 g by mouth every morning.   Yes Historical Provider, MD  furosemide (LASIX) 20 MG tablet Take 20 mg by mouth daily.   Yes Historical Provider, MD  insulin lispro (HUMALOG) 100 UNIT/ML injection Inject 20 Units into the skin daily before breakfast.   Yes Historical Provider, MD  memantine (NAMENDA) 10 MG tablet Take 10  mg by mouth 2 (two) times daily.   Yes Historical Provider, MD  Naproxen Sodium (ALEVE) 220 MG CAPS Take 220 mg by mouth daily as needed (pain).   Yes Historical Provider, MD  OVER THE COUNTER MEDICATION Take 0.5 tablets by mouth daily as needed (allergy). Over the counter store brand ( [facility]) allergy medication   Yes Historical Provider, MD  simvastatin (ZOCOR) 40 MG tablet Take 40 mg by mouth every evening.   Yes Historical Provider, MD  traZODone (DESYREL) 100 MG tablet Take 150 mg by mouth at bedtime.   Yes Historical Provider, MD  vitamin E 1000 UNIT capsule Take 1,000 Units by mouth daily.   Yes Historical Provider, MD   Physical Exam: Filed Vitals:   10/19/12 1715 10/19/12 1730 10/19/12 1745 10/19/12 1800  BP:    118/55  Pulse: 80 82 78 80  Temp:      Resp: 13 14 15 16   SpO2: 92% 92% 93% 91%     General:  Somnolent, arousable, but does not really answer me appropriately. Does not really follow commands.  Eyes: Sclera nonicteric, extraocular movements are intact but difficult to tell  ENT: Normocephalic, atraumatic, mucous membranes are dry  Neck: Supple, no JVD  Cardiovascular: Regular rate and rhythm, S1-S2  Respiratory: Clear to auscultation bilaterally  Abdomen: Soft, obese, nontender, positive bowel sounds  Skin: No other skin breaks, tears or lesions other than left first toe, see below  Musculoskeletal: No clubbing or cyanosis, trace pitting edema. Left first toe notes distal dry gangrene going approximately halfway down and then starts secondary bruising and discoloration with some minimal drainage near incision.  Psychiatric: Patient with chronic dementia  Neurologic: No overt deficits  Labs on Admission:  Basic Metabolic Panel:  Recent Labs Lab 10/13/12 1640 10/19/12 1243  NA 137 140  K 4.1 2.9*  CL 98 100  CO2 32 33*  GLUCOSE 300* 265*  BUN 15 8  CREATININE 0.49* 0.51  CALCIUM 8.5 8.2*   Liver Function Tests:  Recent Labs Lab  10/13/12 1640  AST 10  ALT 5  ALKPHOS 50  BILITOT 0.2*  PROT 6.2  ALBUMIN 2.5*   CBC:  Recent Labs Lab 10/13/12 1640 10/19/12 1243  WBC 9.3 11.0*  NEUTROABS  --  6.8  HGB 10.3* 10.9*  HCT 33.7* 35.4*  MCV 73.3* 73.6*  PLT 244 222   CBG:  Recent Labs Lab 10/13/12 1613  GLUCAP 303*    Radiological Exams on Admission: Dg Foot Complete Right  10/19/2012    IMPRESSION: No acute fracture or subluxation.  Again note cortical irregularity at the tip of distal phalanx great toe.  Osteomyelitis cannot be excluded.  Clinical correlation is necessary.   Original Report Authenticated By: Natasha Mead, M.D.     Assessment/Plan Principal Problem:   Cellulitis of toe of left foot: IV Levaquin. Initial x-ray notes no osteo-, but  we'll check MRI. If positive for osteomyelitis, will consult orthopedics. Active Problems:   DM (diabetes mellitus), type 2, uncontrolled, periph vascular complic: Sliding scale for now. Check A1c. Patient is normally just on morning dose of Humalog, which will not provide adequate coverage throughout the day. Daughter states that overall her sugars have been moderately well controlled except for the past month, likely corresponding with worsening infection.    Dementia: Continue Aricept.    GERD (gastroesophageal reflux disease): Continue PPI.    Dry gangrene   PVD (peripheral vascular disease): Continue Plavix and Pletal plus aspirin. Concern that patient is on all 3 which puts her at high risk for bleeding.  Will likely need to establish patient with a primary care physician here in [place].   Code Status: Daughter confirms DO NOT RESUSCITATE.  Family Communication: Plan discussed with patient and multiple family members at the bedside including her daughter who is primary caregiver.  Disposition Plan: Unclear yet. Possibly home, per short-term skilled nursing  Time spent: 25 minutes  Hollice Espy [facility] Pager [phone]  If  7PM-7AM, please contact night-coverage [email or web address] Password [facility] 10/19/2012, 6:39 PM

## 2012-10-19 NOTE — ED Notes (Signed)
Patient transported to X-ray 

## 2012-10-20 ENCOUNTER — Inpatient Hospital Stay (HOSPITAL_COMMUNITY): Payer: Medicare (Managed Care)

## 2012-10-20 ENCOUNTER — Encounter (HOSPITAL_COMMUNITY): Payer: Self-pay | Admitting: General Practice

## 2012-10-20 DIAGNOSIS — I70269 Atherosclerosis of native arteries of extremities with gangrene, unspecified extremity: Secondary | ICD-10-CM

## 2012-10-20 DIAGNOSIS — I96 Gangrene, not elsewhere classified: Secondary | ICD-10-CM

## 2012-10-20 LAB — CBC
Platelets: 149 10*3/uL — ABNORMAL LOW (ref 150–400)
RBC: 4.63 MIL/uL (ref 3.87–5.11)
WBC: 8.4 10*3/uL (ref 4.0–10.5)

## 2012-10-20 LAB — GLUCOSE, CAPILLARY

## 2012-10-20 LAB — BASIC METABOLIC PANEL
CO2: 28 mEq/L (ref 19–32)
Chloride: 102 mEq/L (ref 96–112)
GFR calc Af Amer: >90 mL/min (ref >90)
Sodium: 138 mEq/L (ref 135–145)

## 2012-10-20 LAB — HEMOGLOBIN A1C: Mean Plasma Glucose: 206 mg/dL — ABNORMAL HIGH (ref <117)

## 2012-10-20 MED ORDER — PIPERACILLIN-TAZOBACTAM 3.375 G IVPB
3.3750 g | Freq: Three times a day (TID) | INTRAVENOUS | Status: DC
  Administered 2012-10-20 – 2012-10-24 (×13): 3.375 g via INTRAVENOUS
  Filled 2012-10-20 (×16): qty 50

## 2012-10-20 MED ORDER — ENOXAPARIN SODIUM 40 MG/0.4ML ~~LOC~~ SOLN
40.0000 mg | SUBCUTANEOUS | Status: DC
  Administered 2012-10-20 – 2012-10-21 (×2): 40 mg via SUBCUTANEOUS
  Filled 2012-10-20 (×4): qty 0.4

## 2012-10-20 MED ORDER — VANCOMYCIN HCL IN DEXTROSE 1-5 GM/200ML-% IV SOLN
1000.0000 mg | INTRAVENOUS | Status: DC
  Administered 2012-10-20 – 2012-10-22 (×3): 1000 mg via INTRAVENOUS
  Filled 2012-10-20 (×5): qty 200

## 2012-10-20 MED ORDER — GADOBENATE DIMEGLUMINE 529 MG/ML IV SOLN
15.0000 mL | Freq: Once | INTRAVENOUS | Status: AC | PRN
  Administered 2012-10-20: 15 mL via INTRAVENOUS

## 2012-10-20 NOTE — Progress Notes (Signed)
ANTIBIOTIC CONSULT NOTE - INITIAL  Pharmacy Consult for Vancomycin/Zosyn Indication: cellulitis/gangrene of right great toe  No Known Allergies  Patient Measurements: Height: 5\' 1"  (154.9 cm) Weight: 149 lb 11.2 oz (67.903 kg) IBW/kg (Calculated) : 47.8  Vital Signs: Temp: 98.3 F (36.8 C) (07/04 0448) Temp src: Oral (07/04 0448) BP: 154/79 mmHg (07/04 0448) Pulse Rate: 87 (07/04 0448) Intake/Output from previous day: 07/03 0701 - 07/04 0700 In: 120 [P.O.:120] Out: -  Intake/Output from this shift: Total I/O In: 120 [P.O.:120] Out: -   Labs:  Recent Labs  10/19/12 1243 10/19/12 1950 10/20/12 0520  WBC 11.0* 11.5* 8.4  HGB 10.9* 11.7* 10.6*  PLT 222 196 149*  CREATININE 0.51 0.48* 0.38*   Estimated Creatinine Clearance: 50.2 ml/min (by C-G formula based on Cr of 0.38). No results found for this basename: VANCOTROUGH, Leodis Binet, VANCORANDOM, GENTTROUGH, GENTPEAK, GENTRANDOM, TOBRATROUGH, TOBRAPEAK, TOBRARND, AMIKACINPEAK, AMIKACINTROU, AMIKACIN,  in the last 72 hours   Microbiology: Recent Results (from the past 720 hour(s))  URINE CULTURE     Status: None   Collection Time    10/13/12  6:09 PM      Result Value Range Status   Specimen Description URINE, CATHETERIZED   Final   Special Requests NONE   Final   Culture  Setup Time 10/13/2012 22:56   Final   Colony Count >=100,000 COLONIES/ML   Final   Culture     Final   Value: DIPHTHEROIDS(CORYNEBACTERIUM SPECIES)     Note: Standardized susceptibility testing for this organism is not available.   Report Status 10/14/2012 FINAL   Final    Medical History: Past Medical History  Diagnosis Date  . Diabetes mellitus without complication   . Arthritis   . Parkinson disease   . Alzheimer disease   . Dementia   . Aortic stenosis   . Heart murmur   . Cancer     Breast     Medications:  Prescriptions prior to admission  Medication Sig Dispense Refill  . aspirin 81 MG tablet Take 81 mg by mouth daily.       . cilostazol (PLETAL) 100 MG tablet Take 100 mg by mouth 2 (two) times daily.      . citalopram (CELEXA) 10 MG tablet Take 15 mg by mouth at bedtime.       . clopidogrel (PLAVIX) 75 MG tablet Take 75 mg by mouth every morning.      . divalproex (DEPAKOTE) 500 MG DR tablet Take 500 mg by mouth at bedtime.      . donepezil (ARICEPT) 10 MG tablet Take 10 mg by mouth at bedtime.      Marland Kitchen esomeprazole (NEXIUM) 40 MG capsule Take 40 mg by mouth daily before breakfast.      . fish oil-omega-3 fatty acids 1000 MG capsule Take 2 g by mouth every morning.      . furosemide (LASIX) 20 MG tablet Take 20 mg by mouth daily.      . insulin lispro (HUMALOG) 100 UNIT/ML injection Inject 20 Units into the skin daily before breakfast.      . memantine (NAMENDA) 10 MG tablet Take 10 mg by mouth 2 (two) times daily.      . Naproxen Sodium (ALEVE) 220 MG CAPS Take 220 mg by mouth daily as needed (pain).      Marland Kitchen OVER THE COUNTER MEDICATION Take 0.5 tablets by mouth daily as needed (allergy). Over the counter store brand ( [facility]) allergy medication      .  simvastatin (ZOCOR) 40 MG tablet Take 40 mg by mouth every evening.      . traZODone (DESYREL) 100 MG tablet Take 150 mg by mouth at bedtime.      . vitamin E 1000 UNIT capsule Take 1,000 Units by mouth daily.       Assessment: 79 y/o female patient admitted with right foot cellulitis requiring broad spectrum antibiotics for possible osteomyelitis.   Goal of Therapy:  Vancomycin trough level 15-20 mcg/ml  Plan:  Vancomycin 1g IV q24 , zosyn 3.375g IV q8h and monitor renal function Measure antibiotic drug levels at steady state Follow up culture results  Verlene Mayer, PharmD, BCPS Pager [phone] 10/20/2012,11:48 AM

## 2012-10-20 NOTE — Progress Notes (Signed)
[facility] PROGRESS NOTE  Emily Gibbs ZOX:[id number] DOB: [birth year] DOA: 10/19/2012 PCP: Pcp Not In System  Assessment/Plan: Principal Problem: Cellulitis/Gangrene of Right Great Toe -Dry gangrene of distal tip of right great toe with cellulitis moving proximally about 3 inches into the dorsum of the foot; currently no drainage noted. Continue with Zosyn day 2, add Vancomycin today -wound culture pending -Right foot X-Ray showed cortical irregularity at the tip of the distal great toe; cannot exclude osteomyelitis -consult vascular surgery for further evaluation for possible amputation-given hx of PVD  Active Problems: Underlying PVD -per daughter at bedside-has stents in the bilateral lower ext -c/w Anti-platelet agents and Statins -have ordered ABI and have asked VVS to evaluate  Diabetes Mellitus Type 2 -blood glucose was elevated at 300 yesterday; has been trending downwards to 177 today -HgbA1c 8.8 yesterday -c/w SSI while inpatient  Dementia -history of chronic Alzheimer's dementia with a decline in responsiveness over the last few weeks -continue memantine 10 mg po BID, and donepezil 10 mg po QHS  GERD -seems to be stable -continue to manage with home meds - pantoprazole 80 mg po daily  Deconditioning - Per Dr., patient has been bedbound for close to 2 months. Prior to that she was ambulating with some assistance. Will get physical therapy evaluation.  Code Status: DNR Family Communication: Daughter at bedside. Disposition Plan: Inpatient and consult the vascular team  for further evaluation.   Consultants:  Vascular Surgery  Procedures:  None at this time, wait for vascular surgery opinion  Antibiotics:  Clindamycin 300 mg po once (10/19/12)  HPI/Subjective: Emily Gibbs is a 79 y.o. female with past history of diabetes mellitus, peripheral vascular disease, and Alzheimer's dementia, who has been residing with her daughter in [place].  She  occasionally takes trips to [place] to visit her second daughter, and sees her physicians down there as needed. Prior to June 1, the patient had a small wound on the distal aspect of her right great toe, which as advised by wound care was small, and they recommended watching and treating to keep it dry. Wound was thought to be dry gangrene and would auto amputate. Patient spent 2-3 weeks with her daughter in [place]. According to the daughter here in town, the patient at that time spent most of her time in bed. When the daughter in [place] saw her mother approximately 10-14 days ago, she noted that the dry gangrenous area had become swollen and blackened extending further down the toe. She took her mother to a podiatrist here in [place], who debrided the lesion. Following debridement, the wound has since had some moderate amount of drainage and has also started having secondary discoloration extending past the toe into the foot. Family was concerned and was advised to bring patient emergency room today (10/19/12). In the emergency room, patient was noted to have mild elevation of WBCs, and blood sugar of 300. Right foot X-Ray could not rule out osteomyelitis. Hospitalists were called for further evaluation and admission.  Today, patient was lying in bed resting upon entering her room.  Her daughter, who lives in [place] was at her bedside and helped to answer a lot of my questions.  As per daughter, her mother has been less responsive with a decrease in appetite for the past few weeks.  The patient had some difficulty with arousal, however opened her eyes and followed some simple commands.  She was unable to speak complete sentences, only a few simple words.  The  right great toe was dry gangrene at the distal tip with bruising and discoloration with a span of 3 inches moving proximally.  I would appreciate a consult with vascular surgery for further evaluation.  Objective: Filed  Vitals:   10-31-2012 1800 31-Oct-2012 1841 Oct 31, 2012 2211 10/20/12 0448  BP: 118/55 169/87 158/77 154/79  Pulse: 80 79 88 87  Temp:  97.9 F (36.6 C) 97.5 F (36.4 C) 98.3 F (36.8 C)  TempSrc:  Oral Oral Oral  Resp: 16 20 18 18   Height:  5\' 1"  (1.549 m)    Weight:  67.903 kg (149 lb 11.2 oz)    SpO2: 91% 97% 95% 95%    Intake/Output Summary (Last 24 hours) at 10/20/12 0848 Last data filed at 10/31/12 2020  Gross per 24 hour  Intake    120 ml  Output      0 ml  Net    120 ml   Filed Weights   2012/10/31 1841  Weight: 67.903 kg (149 lb 11.2 oz)    Exam:   General:  Patient lying in bed; slight difficulty with arousing; A&Ox1  Cardiovascular: RRR; systolic mumur; without gallops, or rubs.  Respiratory: lungs clear to auscultation bilaterally; without wheezes, rhonchi, or rales.  Abdomen: soft, nontender, nondistended, BS+.  Musculoskeletal: PROM in all 4 extremities; slight pitting edema; right first toe with dry gangrene with secondary bruising and discoloration, no drainage noted.  Psychiatric: patient with chronic dementia  Skin: breaks in the skin with mild erythema on anterior aspect of shins bilaterally with minimal drainage; no other ulcerations or bruising noted.  Data Reviewed: Basic Metabolic Panel:  Recent Labs Lab 10/13/12 1640 2012/10/31 1243 31-Oct-2012 1950 10/20/12 0520  NA 137 140  --  138  K 4.1 2.9* 4.0 4.3  CL 98 100  --  102  CO2 32 33*  --  28  GLUCOSE 300* 265*  --  177*  BUN 15 8  --  7  CREATININE 0.49* 0.51 0.48* 0.38*  CALCIUM 8.5 8.2*  --  8.2*   Liver Function Tests:  Recent Labs Lab 10/13/12 1640  AST 10  ALT 5  ALKPHOS 50  BILITOT 0.2*  PROT 6.2  ALBUMIN 2.5*   CBC:  Recent Labs Lab 10/13/12 1640 10/31/2012 1243 10-31-2012 1950 10/20/12 0520  WBC 9.3 11.0* 11.5* 8.4  NEUTROABS  --  6.8  --   --   HGB 10.3* 10.9* 11.7* 10.6*  HCT 33.7* 35.4* 37.8 33.6*  MCV 73.3* 73.6* 73.0* 72.6*  PLT 244 222 196 149*    CBG:  Recent Labs Lab 10/13/12 1613 10/31/2012 1853 10/31/2012 2230 10/20/12 0740  GLUCAP 303* 288* 285* 163*    Recent Results (from the past 240 hour(s))  URINE CULTURE     Status: None   Collection Time    10/13/12  6:09 PM      Result Value Range Status   Specimen Description URINE, CATHETERIZED   Final   Special Requests NONE   Final   Culture  Setup Time 10/13/2012 22:56   Final   Colony Count >=100,000 COLONIES/ML   Final   Culture     Final   Value: DIPHTHEROIDS(CORYNEBACTERIUM SPECIES)     Note: Standardized susceptibility testing for this organism is not available.   Report Status 10/14/2012 FINAL   Final     Studies: Dg Foot Complete Right  10/31/12   *RADIOLOGY REPORT*  Clinical Data: Wound infection  RIGHT FOOT COMPLETE - 3+ VIEW  Comparison:  09/17/2012  Findings: Three views of the right foot submitted.  Again noted diffuse osteopenia.  No acute fracture or subluxation.  Again note cortical irregularity at the tip of distal phalanx great toe. Osteomyelitis cannot be excluded.  Clinical correlation is necessary.  IMPRESSION: No acute fracture or subluxation.  Again note cortical irregularity at the tip of distal phalanx great toe.  Osteomyelitis cannot be excluded.  Clinical correlation is necessary.   Original Report Authenticated By: Natasha Mead, M.D.    Scheduled Meds: . aspirin  81 mg Oral Daily  . cilostazol  100 mg Oral BID  . citalopram  15 mg Oral QHS  . clopidogrel  75 mg Oral q morning - 10a  . divalproex  500 mg Oral QHS  . donepezil  10 mg Oral QHS  . enoxaparin (LOVENOX) injection  30 mg Subcutaneous Q24H  . furosemide  20 mg Oral Daily  . insulin aspart  0-15 Units Subcutaneous TID WC  . insulin aspart  0-5 Units Subcutaneous QHS  . memantine  10 mg Oral BID  . pantoprazole  80 mg Oral Q1200  . simvastatin  40 mg Oral QPM  . sodium chloride  3 mL Intravenous Q12H  . traZODone  150 mg Oral QHS   Continuous Infusions:   Principal Problem:    Cellulitis of great toe of right foot Active Problems:   DM (diabetes mellitus), type 2, uncontrolled, periph vascular complic   Dementia   GERD (gastroesophageal reflux disease)   Dry gangrene   PVD (peripheral vascular disease)    MACHAJ, VERONICA PA-S [facility] 10/20/2012, 8:48 AM  LOS: 1 day     Attending - Patient seen and examined, the with the above assessment and plan. Both documentation has been reviewed, necessary changes have been made. Patient is a 79 year old Caucasian female with a known history of peripheral vascular disease with stents apparently in both her lower legs (placed a few years back), who comes in with worsening gangrene of the right great toe with cellulitis progressing to the dorsum of the foot. Suspect she is going to need right great toe amputation, given the history of peripheral vascular disease-she will likely need vascular studies and VVS evaluation for amputation. Have spoken with Dr. Hart Rochester who will evaluate shortly. Would add vancomycin, continue with empiric Zosyn. Will need physical therapy evaluation while inpatient.  S Nitish Roes

## 2012-10-20 NOTE — Consult Note (Signed)
[facility] CONSULT NOTE 10/20/2012 DOB: 2031-09-30 MRN : [id number]  CC: right great toe black Referring Physician: Hollice Espy, MD  History of Present Illness: Emily Gibbs is a 79 y.o. female With HX of DM, Parkinsons disease, dementia and known PVD with previous stents in prox and distal arteries right leg. (family will bring in records of this intervention) Pt developed an area of dry gangrene on tip of right great toe. She was staying in [facility] with family and was being treated at the wound clinic. She returned to [facility] and was being taken out of car when she fell and scraped both shins. These wounds have been healing well.  The right great toe was debrided at the wound center here in [place] and the gangrene has progressed from the tip to the base of the toe. Pt appears comfortable. History obtained from family members  Past Medical History  Diagnosis Date  . Diabetes mellitus without complication   . Arthritis   . Parkinson disease   . Alzheimer disease   . Dementia   . Aortic stenosis   . Heart murmur   . Cancer     Breast     Past Surgical History  Procedure Laterality Date  . Abdominal hysterectomy    . Mastectomy      Left  . Femoral artery stent       ROS: [x]  Positive  [ ]  Denies    General: [ ]  Weight loss, [ ]  Fever, [ ]  chills Neurologic: [ ]  Dizziness, [ ]  Blackouts, [ ]  Seizure [ ]  Stroke, [ ]  "Mini stroke", [ ]  Slurred speech, [ ]  Temporary blindness; [ ]  weakness in arms or legs, [ ]  Hoarseness Cardiac: [ ]  Chest pain/pressure, [ ]  Shortness of breath at rest [ ]  Shortness of breath with exertion, [ ]  Atrial fibrillation or irregular heartbeat [x]  heart murmur Vascular: [ ]  Pain in legs with walking, [ ]  Pain in legs at rest, [ ]  Pain in legs at night,  [x ] Non-healing ulcer, [ ]  Blood clot in vein/DVT,   Pulmonary: [ ]  Home oxygen, [ ]  Productive cough, [ ]  Coughing up blood, [ ]  Asthma,  [ ]  Wheezing Musculoskeletal:  [  ] Arthritis, [ ]  Low back pain, [ ]  Joint pain Hematologic: [ ]  Easy Bruising, [ ]  Anemia; [ ]  Hepatitis Gastrointestinal: [ ]  Blood in stool, [ ]  Gastroesophageal Reflux/heartburn, [ ]  Trouble swallowing Urinary: [ ]  chronic Kidney disease, [ ]  on HD - [ ]  MWF or [ ]  TTHS, [ ]  Burning with urination, [ ]  Difficulty urinating Skin: [ ]  Rashes, [x ] Wounds Psychological: [ ]  Anxiety, [ ]  Depression  Social History History  Substance Use Topics  . Smoking status: Never Smoker   . Smokeless tobacco: Current User    Types: Snuff  . Alcohol Use: No     No Known Allergies  Current Facility-Administered Medications  Medication Dose Route Frequency Provider Last Rate Last Dose  . 0.9 %  sodium chloride infusion  250 mL Intravenous PRN Hollice Espy, MD      . acetaminophen (TYLENOL) tablet 650 mg  650 mg Oral Q6H PRN Hollice Espy, MD       Or  . acetaminophen (TYLENOL) suppository 650 mg  650 mg Rectal Q6H PRN Hollice Espy, MD      . aspirin chewable tablet 81 mg  81 mg Oral Daily Hollice Espy, MD   [phone]  mg at 10/20/12 1053  . cilostazol (PLETAL) tablet 100 mg  100 mg Oral BID Hollice Espy, MD   100 mg at 10/20/12 1053  . citalopram (CELEXA) tablet 15 mg  15 mg Oral QHS Hollice Espy, MD   15 mg at 10/19/12 2258  . clopidogrel (PLAVIX) tablet 75 mg  75 mg Oral q morning - 10a Hollice Espy, MD   75 mg at 10/20/12 1053  . divalproex (DEPAKOTE) DR tablet 500 mg  500 mg Oral QHS Hollice Espy, MD   500 mg at 10/19/12 2258  . donepezil (ARICEPT) tablet 10 mg  10 mg Oral QHS Hollice Espy, MD   10 mg at 10/19/12 2258  . enoxaparin (LOVENOX) injection 30 mg  30 mg Subcutaneous Q24H Hollice Espy, MD   30 mg at 10/19/12 2029  . furosemide (LASIX) tablet 20 mg  20 mg Oral Daily Hollice Espy, MD   20 mg at 10/20/12 1052  . insulin aspart (novoLOG) injection 0-15 Units  0-15 Units Subcutaneous TID WC Hollice Espy, MD   3 Units at 10/20/12 [phone]  .  insulin aspart (novoLOG) injection 0-5 Units  0-5 Units Subcutaneous QHS Hollice Espy, MD   3 Units at 10/19/12 2302  . memantine (NAMENDA) tablet 10 mg  10 mg Oral BID Hollice Espy, MD   10 mg at 10/20/12 1052  . ondansetron (ZOFRAN) tablet 4 mg  4 mg Oral Q6H PRN Hollice Espy, MD       Or  . ondansetron [facility]) injection 4 mg  4 mg Intravenous Q6H PRN Hollice Espy, MD      . oxyCODONE (Oxy IR/ROXICODONE) immediate release tablet 5 mg  5 mg Oral Q4H PRN Hollice Espy, MD      . pantoprazole (PROTONIX) EC tablet 80 mg  80 mg Oral Q1200 Hollice Espy, MD   80 mg at 10/20/12 1101  . piperacillin-tazobactam (ZOSYN) IVPB 3.375 g  3.375 g Intravenous Q8H Shanker Levora Dredge, MD      . simvastatin (ZOCOR) tablet 40 mg  40 mg Oral QPM Hollice Espy, MD   40 mg at 10/19/12 2029  . sodium chloride 0.9 % injection 3 mL  3 mL Intravenous Q12H Hollice Espy, MD   3 mL at 10/20/12 1101  . sodium chloride 0.9 % injection 3 mL  3 mL Intravenous PRN Hollice Espy, MD      . traZODone (DESYREL) tablet 150 mg  150 mg Oral QHS Hollice Espy, MD   150 mg at 10/19/12 2258  . vancomycin (VANCOCIN) IVPB 1000 mg/200 mL premix  1,000 mg Intravenous Q24H Maretta Bees, MD         Imaging: Dg Foot Complete Right  10/19/2012   *RADIOLOGY REPORT*  Clinical Data: Wound infection  RIGHT FOOT COMPLETE - 3+ VIEW  Comparison: 09/17/2012  Findings: Three views of the right foot submitted.  Again noted diffuse osteopenia.  No acute fracture or subluxation.  Again note cortical irregularity at the tip of distal phalanx great toe. Osteomyelitis cannot be excluded.  Clinical correlation is necessary.  IMPRESSION: No acute fracture or subluxation.  Again note cortical irregularity at the tip of distal phalanx great toe.  Osteomyelitis cannot be excluded.  Clinical correlation is necessary.   Original Report Authenticated By: Natasha Mead, M.D.    Significant Diagnostic Studies: CBC Lab  Results  Component Value Date   WBC 8.4 10/20/2012  HGB 10.6* 10/20/2012   HCT 33.6* 10/20/2012   MCV 72.6* 10/20/2012   PLT 149* 10/20/2012    BMET    Component Value Date/Time   NA 138 10/20/2012 0520   K 4.3 10/20/2012 0520   CL 102 10/20/2012 0520   CO2 28 10/20/2012 0520   GLUCOSE 177* 10/20/2012 0520   BUN 7 10/20/2012 0520   CREATININE 0.38* 10/20/2012 0520   CALCIUM 8.2* 10/20/2012 0520   GFRNONAA >90 10/20/2012 0520   GFRAA >90 10/20/2012 0520    COAG Lab Results  Component Value Date   INR 1.00 10/13/2012   No results found for this basename: PTT     Physical Examination BP Readings from Last 3 Encounters:  10/20/12 154/79  10/13/12 118/62  09/17/12 124/57   Temp Readings from Last 3 Encounters:  10/20/12 98.3 F (36.8 C) Oral  10/13/12 98.5 F (36.9 C) Oral  09/17/12 98 F (36.7 C) Oral   SpO2 Readings from Last 3 Encounters:  10/20/12 95%  10/13/12 98%  09/17/12 94%   Pulse Readings from Last 3 Encounters:  10/20/12 87  10/13/12 82  09/17/12 93    General:  Awake and responding to yes /no questions Gait: bedrest HENT: WNL Eyes: Pupils unequal - left larger than right - looks surgical Pulmonary: normal non-labored breathing Cardiac: RRR, Abdomen: soft, NT, no masses Skin: no rashes, multiple wounds right and left shins in different stages of healing  Vascular Exam/Pulses: palpable femoral pulses bilat Both feet warm  Extremities with ischemic changes, Dry Gangrene right great toe,  open wounds- healing on both shins  Neurologic: Awake, not responding verbally to me but does to family and RN; Flat Affect ;   Non-Invasive Vascular Imaging: ordered  ASSESSMENT/PLAN: Emily Gibbs is a 79 y.o. female With known PVD with stent intervention on right, now has progressing gangrene right great toe Pt to have MRI to evaluate for osteo. Arterial Duplex and ABI's ordered to evaluate flow and stent patency Does have wounds on both shins which are healing, however, we  may need to further evaluate with angiogram prior to toe amp to see if revascularization or PTA of stents is appropriate/needed in this patient to help heal the toe amp.    ROCZNIAK,REGINA J 10/20/2012 12:05 PM  Agree with above assessment Will need angiography to see if candidate for revascularization or further intervention Will plan for Monday
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
## 2012-10-20 NOTE — Progress Notes (Signed)
Pt's daughter brought hemorrhoid cream and vagisil cream for her mother. She said that her mother uses it everyday at home and she would like to continue that while she is here.

## 2012-10-20 NOTE — ED Provider Notes (Signed)
Medical screening examination/treatment/procedure(s) were performed by non-physician practitioner and as supervising physician I was immediately available for consultation/collaboration.  Afsana Liera R. Marti Mclane, MD 10/20/12 1651 

## 2012-10-20 NOTE — Progress Notes (Signed)
VASCULAR LAB PRELIMINARY  ARTERIAL  ABI completed: Unable to ascertain right ABI secondary to inaudible flow.  Left ABI is 0.6 but is probably elevated secondary to calcified vessels.  Duplex imaging revealed severe plaque throughout with minimal flow noted.  Biphasic waveforms in the common femoral, possible stenosis in the mid femoral artery. Minimal, almost continuous looking waveform noted in the distal femoral artery.  Good monophasic flow noted in the popliteal artery, but poor color flow noted throughout from common femoral to popliteal.  Technically difficult secondary to body habitus.    RIGHT    LEFT    PRESSURE WAVEFORM  PRESSURE WAVEFORM  BRACHIAL 151  BRACHIAL    DP  No flow DP 91 Severely dampened monophasic  AT   AT    PT  No flow PT 84 Severely dampened monophasic  PER   PER    GREAT TOE  NA GREAT TOE  NA    RIGHT LEFT  ABI No flow 0.6     Emily Gibbs, RVT 10/20/2012, 12:35 PM

## 2012-10-21 DIAGNOSIS — I739 Peripheral vascular disease, unspecified: Secondary | ICD-10-CM

## 2012-10-21 DIAGNOSIS — F0391 Unspecified dementia with behavioral disturbance: Secondary | ICD-10-CM

## 2012-10-21 LAB — BASIC METABOLIC PANEL
BUN: 5 mg/dL — ABNORMAL LOW (ref 6–23)
Creatinine, Ser: 0.45 mg/dL — ABNORMAL LOW (ref 0.50–1.10)
GFR calc Af Amer: >90 mL/min (ref >90)
GFR calc non Af Amer: >90 mL/min (ref >90)
Glucose, Bld: 215 mg/dL — ABNORMAL HIGH (ref 70–99)

## 2012-10-21 LAB — GLUCOSE, CAPILLARY: Glucose-Capillary: 274 mg/dL — ABNORMAL HIGH (ref 70–99)

## 2012-10-21 LAB — CBC
HCT: 34 % — ABNORMAL LOW (ref 36.0–46.0)
Hemoglobin: 10.5 g/dL — ABNORMAL LOW (ref 12.0–15.0)
RDW: 15.6 % — ABNORMAL HIGH (ref 11.5–15.5)
WBC: 9.3 10*3/uL (ref 4.0–10.5)

## 2012-10-21 MED ORDER — DOCUSATE SODIUM 100 MG PO CAPS
100.0000 mg | ORAL_CAPSULE | Freq: Once | ORAL | Status: AC
  Administered 2012-10-22: 100 mg via ORAL
  Filled 2012-10-21: qty 1

## 2012-10-21 NOTE — Progress Notes (Signed)
Pt's daughter fears that the left great toe is starting to look necrotic as well. She is also concerned about left fingers- they seemed to be red and swollen a bit. Contacting night MD to relay concern.

## 2012-10-21 NOTE — Progress Notes (Signed)
PATIENT DETAILS Name: Emily Gibbs Age: 79 y.o. Sex: female Date of Birth: 08-Oct-1931 Admit Date: 10/19/2012 Admitting Physician Hollice Espy, MD PCP:Pcp Not In System  Subjective: No major issues overnight  Assessment/Plan: Cellulitis/Gangrene of Right Great Toe  -Dry gangrene of distal tip of right great toe with cellulitis moving proximally about 3 inches into the dorsum of the foot; currently no drainage noted. -Appreciate VVS input-pt requires a AKA-family deciding -Continue Zosyn day 3, Vanco day 2  Underlying PVD  -per daughter at bedside-has stents in the bilateral lower ext  -c/w Anti-platelet agents and Statins -per VVS-requires AKA  DM -CBG's with moderate control -c/w SSI  Dementia  -history of chronic Alzheimer's dementia with a decline in responsiveness over the last few weeks  -continue memantine 10 mg po BID, and donepezil 10 mg po QHS  GERD  -seems to be stable  -continue to manage with home meds - pantoprazole 80 mg po daily   Deconditioning  - Per Daughter, patient has been bedbound for close to 2 months. Prior to that she was ambulating with some assistance. Will get physical therapy evaluation.   Disposition: Remain inpatient  DVT Prophylaxis: Prophylactic Lovenox   Code Status:  DNR  Family Communication Daughter at bedside  Procedures:  ABI 7/4  CONSULTS:  vascular surgery   MEDICATIONS: Scheduled Meds: . aspirin  81 mg Oral Daily  . cilostazol  100 mg Oral BID  . citalopram  15 mg Oral QHS  . clopidogrel  75 mg Oral q morning - 10a  . divalproex  500 mg Oral QHS  . [START ON 10/22/2012] docusate sodium  100 mg Oral Once  . donepezil  10 mg Oral QHS  . enoxaparin (LOVENOX) injection  40 mg Subcutaneous Q24H  . furosemide  20 mg Oral Daily  . insulin aspart  0-15 Units Subcutaneous TID WC  . insulin aspart  0-5 Units Subcutaneous QHS  . memantine  10 mg Oral BID  . pantoprazole  80 mg Oral Q1200  .  piperacillin-tazobactam (ZOSYN)  IV  3.375 g Intravenous Q8H  . simvastatin  40 mg Oral QPM  . sodium chloride  3 mL Intravenous Q12H  . traZODone  150 mg Oral QHS  . vancomycin  1,000 mg Intravenous Q24H   Continuous Infusions:  PRN Meds:.sodium chloride, acetaminophen, acetaminophen, ondansetron (ZOFRAN) IV, ondansetron, oxyCODONE, sodium chloride  Antibiotics: Anti-infectives   Start     Dose/Rate Route Frequency Ordered Stop   10/20/12 1400  piperacillin-tazobactam (ZOSYN) IVPB 3.375 g     3.375 g 12.5 mL/hr over 240 Minutes Intravenous 3 times per day 10/20/12 1204     10/20/12 1230  vancomycin (VANCOCIN) IVPB 1000 mg/200 mL premix     1,000 mg 200 mL/hr over 60 Minutes Intravenous Every 24 hours 10/20/12 1204     10/19/12 1415  piperacillin-tazobactam (ZOSYN) IVPB 3.375 g     3.375 g 12.5 mL/hr over 240 Minutes Intravenous  Once 10/19/12 1407 10/19/12 1635   10/19/12 1215  clindamycin (CLEOCIN) capsule 300 mg     300 mg Oral  Once 10/19/12 1202 10/19/12 1340       PHYSICAL EXAM: Vital signs in last 24 hours: Filed Vitals:   10/20/12 0448 10/20/12 1500 10/20/12 2036 10/21/12 0617  BP: 154/79 143/82 130/73 123/76  Pulse: 87 92 83 95  Temp: 98.3 F (36.8 C) 98.4 F (36.9 C) 99.2 F (37.3 C) 98.4 F (36.9 C)  TempSrc: Oral Oral Oral Oral  Resp: 18 18  20 18  Height:      Weight:      SpO2: 95% 96% 94% 93%    Weight change:  Filed Weights   10/19/12 1841  Weight: 67.903 kg (149 lb 11.2 oz)   Body mass index is 28.3 kg/(m^2).   Gen Exam: Awake and alert with clear but slow speech.   Neck: Supple, No JVD.   Chest: B/L Clear.   CVS: S1 S2 Regular, no murmurs.  Abdomen: soft, BS +, non tender, non distended.  Extremities: no edema, lower extremities warm to touch.Gangrene of entire right first toe with some erythema proximally both anteriorly and posteriorly. Abrasions on right pretibial area from recent fall. Neurologic: Non Focal.   Skin: No Rash.   Wounds:  N/A.    Intake/Output from previous day:  Intake/Output Summary (Last 24 hours) at 10/21/12 1018 Last data filed at 10/20/12 1900  Gross per 24 hour  Intake    642 ml  Output      0 ml  Net    642 ml     LAB RESULTS: CBC  Recent Labs Lab 10/19/12 1243 10/19/12 1950 10/20/12 0520 10/21/12 0448  WBC 11.0* 11.5* 8.4 9.3  HGB 10.9* 11.7* 10.6* 10.5*  HCT 35.4* 37.8 33.6* 34.0*  PLT 222 196 149* 187  MCV 73.6* 73.0* 72.6* 72.3*  MCH 22.7* 22.6* 22.9* 22.3*  MCHC 30.8 31.0 31.5 30.9  RDW 15.7* 15.8* 15.7* 15.6*  LYMPHSABS 2.8  --   --   --   MONOABS 1.2*  --   --   --   EOSABS 0.2  --   --   --   BASOSABS 0.0  --   --   --     Chemistries   Recent Labs Lab 10/19/12 1243 10/19/12 1950 10/20/12 0520 10/21/12 0448  NA 140  --  138 138  K 2.9* 4.0 4.3 3.6  CL 100  --  102 100  CO2 33*  --  28 32  GLUCOSE 265*  --  177* 215*  BUN 8  --  7 5*  CREATININE 0.51 0.48* 0.38* 0.45*  CALCIUM 8.2*  --  8.2* 8.4    CBG:  Recent Labs Lab 10/20/12 0740 10/20/12 1538 10/20/12 1746 10/20/12 2137 10/21/12 0758  GLUCAP 163* 211* 266* 208* 212*    GFR Estimated Creatinine Clearance: 50.2 ml/min (by C-G formula based on Cr of 0.45).  Coagulation profile No results found for this basename: INR, PROTIME,  in the last 168 hours  Cardiac Enzymes No results found for this basename: CK, CKMB, TROPONINI, MYOGLOBIN,  in the last 168 hours  No components found with this basename: POCBNP,  No results found for this basename: DDIMER,  in the last 72 hours  Recent Labs  10/19/12 1950  HGBA1C 8.8*   No results found for this basename: CHOL, HDL, LDLCALC, TRIG, CHOLHDL, LDLDIRECT,  in the last 72 hours No results found for this basename: TSH, T4TOTAL, FREET3, T3FREE, THYROIDAB,  in the last 72 hours No results found for this basename: VITAMINB12, FOLATE, FERRITIN, TIBC, IRON, RETICCTPCT,  in the last 72 hours No results found for this basename: LIPASE, AMYLASE,  in the  last 72 hours  Urine Studies No results found for this basename: UACOL, UAPR, USPG, UPH, UTP, UGL, UKET, UBIL, UHGB, UNIT, UROB, ULEU, UEPI, UWBC, URBC, UBAC, CAST, CRYS, UCOM, BILUA,  in the last 72 hours  MICROBIOLOGY: Recent Results (from the past 240 hour(s))  URINE CULTURE  Status: None   Collection Time    10/13/12  6:09 PM      Result Value Range Status   Specimen Description URINE, CATHETERIZED   Final   Special Requests NONE   Final   Culture  Setup Time 10/13/2012 22:56   Final   Colony Count >=100,000 COLONIES/ML   Final   Culture     Final   Value: DIPHTHEROIDS(CORYNEBACTERIUM SPECIES)     Note: Standardized susceptibility testing for this organism is not available.   Report Status 10/14/2012 FINAL   Final    RADIOLOGY STUDIES/RESULTS: Ct Head Wo Contrast  10/13/2012   *RADIOLOGY REPORT*  Clinical Data: Altered mental status.  Slurred speech.  Remote history breast cancer.  CT HEAD WITHOUT CONTRAST  Technique:  Contiguous axial images were obtained from the base of the skull through the vertex without contrast.  Comparison: None.  Findings: Remote appearing right occipital lobe infarct noted. Faint calcification along the left globus pallidus nucleus is likely physiologic.  There is chronic microvascular white matter disease.  There is more confluent white matter hypodensity in the left parietal lobe extending to the attenuated left parietal cortex, potentially from chronic or subacute stroke small chronic-appearing left periventricular white matter lacunar infarct noted.  No intracranial hemorrhage or discrete mass lesion is identified. Homogeneous abnormal high density in left globe, query prior injected silicone oil.  There is mild frothy material in the left sphenoid sinus.  IMPRESSION:  1.  Subacute or chronic left high parietal infarct.  Remote right occipital lobe infarct. 2. Periventricular and corona radiata white matter hypodensities are most compatible with chronic  ischemic microvascular white matter disease. 3.  Very homogeneous abnormal high density in left globe.  Although hemorrhage and other conditions can cause this appearance, correlate with any history of injected silicone oil in the globe. 4.  Chronic left sphenoid sinusitis.   Original Report Authenticated By: Gaylyn Rong, M.D.   Mr Foot Left W Wo Contrast  10/20/2012   *RADIOLOGY REPORT*  Clinical Data: Cellulitis and gangrenous left toe.  Diabetes. Query osteomyelitis.  MRI OF THE LEFT FOREFOOT WITHOUT AND WITH CONTRAST  Technique:  Multiplanar, multisequence MR imaging was performed both before and after administration of intravenous contrast.  Contrast: 15mL MULTIHANCE GADOBENATE DIMEGLUMINE 529 MG/ML IV SOLN  Comparison: 07/30/2011  Findings: Despite efforts by the patient and technologist, motion artifact is present on some series of today's examination and could not be totally eliminated.  This reduces diagnostic sensitivity and specificity.  There appears to be reduction of soft tissue in the great toe around the distal half of the distal phalanx concerning for tissue necrosis/ulceration on image 4 of series 10 and image 16 of series 9 there appears to be increased signal in the distal phalanx on inversion recovery weighted images.  However, no overt enhancement is identified in the bone.  No drainable abscess observed. Plantar muscular atrophy noted with low-level edema tracking along the plantar fascia.  IMPRESSION:  1.  Abnormal increased inversion recovery weighted signal in the distal phalanx of the great toe with surrounding soft tissue defect, but without abnormal enhancement in the phalanx. Admittedly today's exam is limited by the prominent degree of motion artifact.  Infarct or distal phalangeal devascularization as a cause for lack of enhancement is raised as a possibility, although it seems unlikely that the T1 precontrast fatty marrow would be maintained in this situation.  Accordingly,  the results are atypical and suspicious for but not entirely diagnostic of osteomyelitis  in the distal phalanx of the great toe. 2.  No drainable abscess observed.   Original Report Authenticated By: Gaylyn Rong, M.D.   Dg Chest Portable 1 View  10/13/2012   *RADIOLOGY REPORT*  Clinical Data: Altered mental status.  Weakness.  History of diabetes and Alzheimer's disease.  PORTABLE CHEST - 1 VIEW  Comparison: Limited correlation is made with an abdominal CT 01/02/2011.  Findings: 1748 hours.  There are low lung volumes with left greater than right basilar air space opacities.  Heart size is normal. There is aortic atherosclerosis.  There may be a small amount of pleural fluid on the left.  Telemetry leads overlie the chest.  IMPRESSION: Patchy bibasilar air space opacities worrisome for aspiration pneumonia.   Original Report Authenticated By: Carey Bullocks, M.D.   Dg Foot Complete Right  10/19/2012   *RADIOLOGY REPORT*  Clinical Data: Wound infection  RIGHT FOOT COMPLETE - 3+ VIEW  Comparison: 09/17/2012  Findings: Three views of the right foot submitted.  Again noted diffuse osteopenia.  No acute fracture or subluxation.  Again note cortical irregularity at the tip of distal phalanx great toe. Osteomyelitis cannot be excluded.  Clinical correlation is necessary.  IMPRESSION: No acute fracture or subluxation.  Again note cortical irregularity at the tip of distal phalanx great toe.  Osteomyelitis cannot be excluded.  Clinical correlation is necessary.   Original Report Authenticated By: Natasha Mead, M.D.    Jeoffrey Massed, MD  [facility] Pager:[phone]  If 7PM-7AM, please contact night-coverage [email or web address] Password [email or web address] 10/21/2012, 10:18 AM   LOS: 2 days

## 2012-10-21 NOTE — Progress Notes (Signed)
Patient ID: Emily Gibbs, female   DOB: 1931-10-07, 79 y.o.   MRN: [id number] Vascular Surgery Progress Note  Subjective: Elderly diabetic with gangrenous right first toe and cellulitis. Status post stenting of right SFIE and popliteal arteries in [place]. Patient is non-ambulatory for at least the last 5-6 weeks and has dementia. Type 1 diabetes mellitus.  Objective:  Filed Vitals:   10/21/12 0617  BP: 123/76  Pulse: 95  Temp: 98.4 F (36.9 C)  Resp: 18    Gen. opens eyes and looks at knee but does not answer questions Lungs no rhonchi or wheezing Right lower extremity with 2+ femoral pulse palpable. No popliteal or distal pulses palpable. Gangrene of entire right first toe with some erythema proximally both anteriorly and posteriorly. Abrasions on right pretibial area from recent fall.   Labs:  Recent Labs Lab 10/19/12 1950 10/20/12 0520 10/21/12 0448  CREATININE 0.48* 0.38* 0.45*    Recent Labs Lab 10/19/12 1243 10/19/12 1950 10/20/12 0520 10/21/12 0448  NA 140  --  138 138  K 2.9* 4.0 4.3 3.6  CL 100  --  102 100  CO2 33*  --  28 32  BUN 8  --  7 5*  CREATININE 0.51 0.48* 0.38* 0.45*  GLUCOSE 265*  --  177* 215*  CALCIUM 8.2*  --  8.2* 8.4    Recent Labs Lab 10/19/12 1950 10/20/12 0520 10/21/12 0448  WBC 11.5* 8.4 9.3  HGB 11.7* 10.6* 10.5*  HCT 37.8 33.6* 34.0*  PLT 196 149* 187   No results found for this basename: INR,  in the last 168 hours  I/O last 3 completed shifts: In: 53 [P.O.:882] Out: -   Imaging: Mr Foot Left W Wo Contrast  10/20/2012   *RADIOLOGY REPORT*  Clinical Data: Cellulitis and gangrenous left toe.  Diabetes. Query osteomyelitis.  MRI OF THE LEFT FOREFOOT WITHOUT AND WITH CONTRAST  Technique:  Multiplanar, multisequence MR imaging was performed both before and after administration of intravenous contrast.  Contrast: 15mL MULTIHANCE GADOBENATE DIMEGLUMINE 529 MG/ML IV SOLN  Comparison: 07/30/2011  Findings: Despite efforts by  the patient and technologist, motion artifact is present on some series of today's examination and could not be totally eliminated.  This reduces diagnostic sensitivity and specificity.  There appears to be reduction of soft tissue in the great toe around the distal half of the distal phalanx concerning for tissue necrosis/ulceration on image 4 of series 10 and image 16 of series 9 there appears to be increased signal in the distal phalanx on inversion recovery weighted images.  However, no overt enhancement is identified in the bone.  No drainable abscess observed. Plantar muscular atrophy noted with low-level edema tracking along the plantar fascia.  IMPRESSION:  1.  Abnormal increased inversion recovery weighted signal in the distal phalanx of the great toe with surrounding soft tissue defect, but without abnormal enhancement in the phalanx. Admittedly today's exam is limited by the prominent degree of motion artifact.  Infarct or distal phalangeal devascularization as a cause for lack of enhancement is raised as a possibility, although it seems unlikely that the T1 precontrast fatty marrow would be maintained in this situation.  Accordingly, the results are atypical and suspicious for but not entirely diagnostic of osteomyelitis in the distal phalanx of the great toe. 2.  No drainable abscess observed.   Original Report Authenticated By: Gaylyn Rong, M.D.   Dg Foot Complete Right  10/19/2012   *RADIOLOGY REPORT*  Clinical Data: Wound infection  RIGHT FOOT COMPLETE - 3+ VIEW  Comparison: 09/17/2012  Findings: Three views of the right foot submitted.  Again noted diffuse osteopenia.  No acute fracture or subluxation.  Again note cortical irregularity at the tip of distal phalanx great toe. Osteomyelitis cannot be excluded.  Clinical correlation is necessary.  IMPRESSION: No acute fracture or subluxation.  Again note cortical irregularity at the tip of distal phalanx great toe.  Osteomyelitis cannot be  excluded.  Clinical correlation is necessary.   Original Report Authenticated By: Natasha Mead, M.D.    Assessment/Plan:   LOS: 2 days  s/p   A long discussion with patient's daughter and family. This patient is nonambulatory and limb salvage is very unlikely even with revascularization which I doubt will be feasible. Patient has had previous stenting procedures x2 and has very calcified vessels by duplex scanning including entire superficial femoral popliteal and tibial system with an audible flow right foot  I recommended right AKA and patient's daughter and family will discuss this. If they are in agreement we will try to schedule this for Monday   [name], MD 10/21/2012 9:35 AM

## 2012-10-22 LAB — SURGICAL PCR SCREEN
MRSA, PCR: NEGATIVE
Staphylococcus aureus: NEGATIVE

## 2012-10-22 LAB — GLUCOSE, CAPILLARY: Glucose-Capillary: 202 mg/dL — ABNORMAL HIGH (ref 70–99)

## 2012-10-22 NOTE — Progress Notes (Signed)
PT Cancellation/Discontinue Note  Patient Details Name: Emily Gibbs MRN: [id number] DOB: 19-Feb-1932   Cancelled Treatment:    Reason Eval/Treat Not Completed: Medical issues which prohibited therapy.  Patient is scheduled for AKA in am. Will hold PT for now and await orders following surgery.  MD:  Please reorder PT post-op when appropriate for patient.  Thank you!   [name] 10/22/2012, 1:13 PM [name]. [name], [name] Acute Rehab Services Pager [phone]

## 2012-10-22 NOTE — Progress Notes (Signed)
RN offered to change dressing on pt's legs and foot- daughter said that the dressing still looked clean so it we can leave it for now. RN reinforced dressing. Pt is resting.

## 2012-10-22 NOTE — Progress Notes (Signed)
Patient ID: Emily Gibbs, female   DOB: 08-Oct-1931, 79 y.o.   MRN: [id number] Vascular Surgery Progress Note  Subjective: Gangrene right first toe with severe diffuse vascular occlusive disease and nonambulatory patient with dementia-needs right AKA. Daughter is present today and is agreeable to proceeding tomorrow with right AKA  Objective:  Filed Vitals:   10/22/12 0520  BP: 106/67  Pulse: 92  Temp: 97.9 F (36.6 C)  Resp: 18    General alert but does not answer questions Right lower turbinate with dressing on right foot-dry   Labs:  Recent Labs Lab 10/19/12 1950 10/20/12 0520 10/21/12 0448  CREATININE 0.48* 0.38* 0.45*    Recent Labs Lab 10/19/12 1243 10/19/12 1950 10/20/12 0520 10/21/12 0448  NA 140  --  138 138  K 2.9* 4.0 4.3 3.6  CL 100  --  102 100  CO2 33*  --  28 32  BUN 8  --  7 5*  CREATININE 0.51 0.48* 0.38* 0.45*  GLUCOSE 265*  --  177* 215*  CALCIUM 8.2*  --  8.2* 8.4    Recent Labs Lab 10/19/12 1950 10/20/12 0520 10/21/12 0448  WBC 11.5* 8.4 9.3  HGB 11.7* 10.6* 10.5*  HCT 37.8 33.6* 34.0*  PLT 196 149* 187   No results found for this basename: INR,  in the last 168 hours  I/O last 3 completed shifts: In: 180 [P.O.:180] Out: -   Imaging: Mr Foot Left W Wo Contrast  10/20/2012   *RADIOLOGY REPORT*  Clinical Data: Cellulitis and gangrenous left toe.  Diabetes. Query osteomyelitis.  MRI OF THE LEFT FOREFOOT WITHOUT AND WITH CONTRAST  Technique:  Multiplanar, multisequence MR imaging was performed both before and after administration of intravenous contrast.  Contrast: 15mL MULTIHANCE GADOBENATE DIMEGLUMINE 529 MG/ML IV SOLN  Comparison: 07/30/2011  Findings: Despite efforts by the patient and technologist, motion artifact is present on some series of today's examination and could not be totally eliminated.  This reduces diagnostic sensitivity and specificity.  There appears to be reduction of soft tissue in the great toe around the distal  half of the distal phalanx concerning for tissue necrosis/ulceration on image 4 of series 10 and image 16 of series 9 there appears to be increased signal in the distal phalanx on inversion recovery weighted images.  However, no overt enhancement is identified in the bone.  No drainable abscess observed. Plantar muscular atrophy noted with low-level edema tracking along the plantar fascia.  IMPRESSION:  1.  Abnormal increased inversion recovery weighted signal in the distal phalanx of the great toe with surrounding soft tissue defect, but without abnormal enhancement in the phalanx. Admittedly today's exam is limited by the prominent degree of motion artifact.  Infarct or distal phalangeal devascularization as a cause for lack of enhancement is raised as a possibility, although it seems unlikely that the T1 precontrast fatty marrow would be maintained in this situation.  Accordingly, the results are atypical and suspicious for but not entirely diagnostic of osteomyelitis in the distal phalanx of the great toe. 2.  No drainable abscess observed.   Original Report Authenticated By: Gaylyn Rong, M.D.    Assessment/Plan:    LOS: 3 days  s/p Procedure(s): AMPUTATION ABOVE KNEE  Discuss situation at length with patient's daughter and family is agreeable to proceeding with right AKA tomorrow.  Scheduled for Dr. early tomorrow a.m.   Emily Gip, MD 10/22/2012 8:48 AM

## 2012-10-22 NOTE — Progress Notes (Signed)
PATIENT DETAILS Name: Briea Mcenery Age: 79 y.o. Sex: female Date of Birth: 1932/04/03 Admit Date: 10/19/2012 Admitting Physician Hollice Espy, MD PCP:Pcp Not In System  Subjective: No major issues overnight-family agreeable for AKA  Assessment/Plan: Cellulitis/Gangrene of Right Great Toe  -Dry gangrene of distal tip of right great toe with cellulitis moving proximally about 3 inches into the dorsum of the foot; currently no drainage noted. -Appreciate VVS input-family agreeable with AKA-scheduled for am -Continue Zosyn day 4, Vanco day 3  Underlying PVD  -per daughter at bedside-has stents in the bilateral lower ext  -c/w Anti-platelet agents and Statins -per VVS-requires AKA  DM -CBG's with moderate control -c/w SSI  Dementia  -history of chronic Alzheimer's dementia with a decline in responsiveness over the last few weeks  -continue memantine 10 mg po BID, and donepezil 10 mg po QHS  GERD  -seems to be stable  -continue to manage with home meds - pantoprazole 80 mg po daily   Deconditioning  - Per Daughter, patient has been bedbound for close to 2 months. Prior to that she was ambulating with some assistance. Will get physical therapy evaluation.  Poor overall prognosis-daughter aware  Disposition: Remain inpatient  DVT Prophylaxis: Prophylactic Lovenox   Code Status:  DNR  Family Communication Daughter at bedside  Procedures:  ABI 7/4  CONSULTS:  vascular surgery   MEDICATIONS: Scheduled Meds: . aspirin  81 mg Oral Daily  . cilostazol  100 mg Oral BID  . citalopram  15 mg Oral QHS  . clopidogrel  75 mg Oral q morning - 10a  . divalproex  500 mg Oral QHS  . donepezil  10 mg Oral QHS  . enoxaparin (LOVENOX) injection  40 mg Subcutaneous Q24H  . furosemide  20 mg Oral Daily  . insulin aspart  0-15 Units Subcutaneous TID WC  . insulin aspart  0-5 Units Subcutaneous QHS  . memantine  10 mg Oral BID  . pantoprazole  80 mg Oral Q1200  .  piperacillin-tazobactam (ZOSYN)  IV  3.375 g Intravenous Q8H  . simvastatin  40 mg Oral QPM  . sodium chloride  3 mL Intravenous Q12H  . traZODone  150 mg Oral QHS  . vancomycin  1,000 mg Intravenous Q24H   Continuous Infusions:  PRN Meds:.sodium chloride, acetaminophen, acetaminophen, ondansetron (ZOFRAN) IV, ondansetron, oxyCODONE, sodium chloride  Antibiotics: Anti-infectives   Start     Dose/Rate Route Frequency Ordered Stop   10/20/12 1400  piperacillin-tazobactam (ZOSYN) IVPB 3.375 g     3.375 g 12.5 mL/hr over 240 Minutes Intravenous 3 times per day 10/20/12 1204     10/20/12 1230  vancomycin (VANCOCIN) IVPB 1000 mg/200 mL premix     1,000 mg 200 mL/hr over 60 Minutes Intravenous Every 24 hours 10/20/12 1204     10/19/12 1415  piperacillin-tazobactam (ZOSYN) IVPB 3.375 g     3.375 g 12.5 mL/hr over 240 Minutes Intravenous  Once 10/19/12 1407 10/19/12 1635   10/19/12 1215  clindamycin (CLEOCIN) capsule 300 mg     300 mg Oral  Once 10/19/12 1202 10/19/12 1340       PHYSICAL EXAM: Vital signs in last 24 hours: Filed Vitals:   10/21/12 0617 10/21/12 1544 10/21/12 2106 10/22/12 0520  BP: 123/76 118/70 133/74 106/67  Pulse: 95 95 87 92  Temp: 98.4 F (36.9 C) 98.6 F (37 C) 97.5 F (36.4 C) 97.9 F (36.6 C)  TempSrc: Oral Oral Oral Oral  Resp: 18 16 18 18   Height:  Weight:      SpO2: 93% 94% 95% 93%    Weight change:  Filed Weights   10/19/12 1841  Weight: 67.903 kg (149 lb 11.2 oz)   Body mass index is 28.3 kg/(m^2).   Gen Exam: Awake and alert with clear but slow speech.   Neck: Supple, No JVD.   Chest: B/L Clear.   CVS: S1 S2 Regular, systolic murmur +.  Abdomen: soft, BS +, non tender, non distended.  Extremities: no edema, lower extremities warm to touch.Gangrene of entire right first toe with some erythema proximally both anteriorly and posteriorly. Abrasions on right pretibial area from recent fall. Neurologic: Non Focal.   Skin: No Rash.    Wounds: N/A.    Intake/Output from previous day: No intake or output data in the 24 hours ending 10/22/12 0958   LAB RESULTS: CBC  Recent Labs Lab 10/19/12 1243 10/19/12 1950 10/20/12 0520 10/21/12 0448  WBC 11.0* 11.5* 8.4 9.3  HGB 10.9* 11.7* 10.6* 10.5*  HCT 35.4* 37.8 33.6* 34.0*  PLT 222 196 149* 187  MCV 73.6* 73.0* 72.6* 72.3*  MCH 22.7* 22.6* 22.9* 22.3*  MCHC 30.8 31.0 31.5 30.9  RDW 15.7* 15.8* 15.7* 15.6*  LYMPHSABS 2.8  --   --   --   MONOABS 1.2*  --   --   --   EOSABS 0.2  --   --   --   BASOSABS 0.0  --   --   --     Chemistries   Recent Labs Lab 10/19/12 1243 10/19/12 1950 10/20/12 0520 10/21/12 0448  NA 140  --  138 138  K 2.9* 4.0 4.3 3.6  CL 100  --  102 100  CO2 33*  --  28 32  GLUCOSE 265*  --  177* 215*  BUN 8  --  7 5*  CREATININE 0.51 0.48* 0.38* 0.45*  CALCIUM 8.2*  --  8.2* 8.4    CBG:  Recent Labs Lab 10/20/12 2137 10/21/12 0758 10/21/12 1223 10/21/12 2143 10/22/12 0745  GLUCAP 208* 212* 286* 274* 202*    GFR Estimated Creatinine Clearance: 50.2 ml/min (by C-G formula based on Cr of 0.45).  Coagulation profile No results found for this basename: INR, PROTIME,  in the last 168 hours  Cardiac Enzymes No results found for this basename: CK, CKMB, TROPONINI, MYOGLOBIN,  in the last 168 hours  No components found with this basename: POCBNP,  No results found for this basename: DDIMER,  in the last 72 hours  Recent Labs  10/19/12 1950  HGBA1C 8.8*   No results found for this basename: CHOL, HDL, LDLCALC, TRIG, CHOLHDL, LDLDIRECT,  in the last 72 hours No results found for this basename: TSH, T4TOTAL, FREET3, T3FREE, THYROIDAB,  in the last 72 hours No results found for this basename: VITAMINB12, FOLATE, FERRITIN, TIBC, IRON, RETICCTPCT,  in the last 72 hours No results found for this basename: LIPASE, AMYLASE,  in the last 72 hours  Urine Studies No results found for this basename: UACOL, UAPR, USPG, UPH, UTP,  UGL, UKET, UBIL, UHGB, UNIT, UROB, ULEU, UEPI, UWBC, URBC, UBAC, CAST, CRYS, UCOM, BILUA,  in the last 72 hours  MICROBIOLOGY: Recent Results (from the past 240 hour(s))  URINE CULTURE     Status: None   Collection Time    10/13/12  6:09 PM      Result Value Range Status   Specimen Description URINE, CATHETERIZED   Final   Special Requests NONE  Final   Culture  Setup Time 10/13/2012 22:56   Final   Colony Count >=100,000 COLONIES/ML   Final   Culture     Final   Value: DIPHTHEROIDS(CORYNEBACTERIUM SPECIES)     Note: Standardized susceptibility testing for this organism is not available.   Report Status 10/14/2012 FINAL   Final    RADIOLOGY STUDIES/RESULTS: Ct Head Wo Contrast  10/13/2012   *RADIOLOGY REPORT*  Clinical Data: Altered mental status.  Slurred speech.  Remote history breast cancer.  CT HEAD WITHOUT CONTRAST  Technique:  Contiguous axial images were obtained from the base of the skull through the vertex without contrast.  Comparison: None.  Findings: Remote appearing right occipital lobe infarct noted. Faint calcification along the left globus pallidus nucleus is likely physiologic.  There is chronic microvascular white matter disease.  There is more confluent white matter hypodensity in the left parietal lobe extending to the attenuated left parietal cortex, potentially from chronic or subacute stroke small chronic-appearing left periventricular white matter lacunar infarct noted.  No intracranial hemorrhage or discrete mass lesion is identified. Homogeneous abnormal high density in left globe, query prior injected silicone oil.  There is mild frothy material in the left sphenoid sinus.  IMPRESSION:  1.  Subacute or chronic left high parietal infarct.  Remote right occipital lobe infarct. 2. Periventricular and corona radiata white matter hypodensities are most compatible with chronic ischemic microvascular white matter disease. 3.  Very homogeneous abnormal high density in left  globe.  Although hemorrhage and other conditions can cause this appearance, correlate with any history of injected silicone oil in the globe. 4.  Chronic left sphenoid sinusitis.   Original Report Authenticated By: Gaylyn Rong, M.D.   Mr Foot Left W Wo Contrast  10/20/2012   *RADIOLOGY REPORT*  Clinical Data: Cellulitis and gangrenous left toe.  Diabetes. Query osteomyelitis.  MRI OF THE LEFT FOREFOOT WITHOUT AND WITH CONTRAST  Technique:  Multiplanar, multisequence MR imaging was performed both before and after administration of intravenous contrast.  Contrast: 15mL MULTIHANCE GADOBENATE DIMEGLUMINE 529 MG/ML IV SOLN  Comparison: 07/30/2011  Findings: Despite efforts by the patient and technologist, motion artifact is present on some series of today's examination and could not be totally eliminated.  This reduces diagnostic sensitivity and specificity.  There appears to be reduction of soft tissue in the great toe around the distal half of the distal phalanx concerning for tissue necrosis/ulceration on image 4 of series 10 and image 16 of series 9 there appears to be increased signal in the distal phalanx on inversion recovery weighted images.  However, no overt enhancement is identified in the bone.  No drainable abscess observed. Plantar muscular atrophy noted with low-level edema tracking along the plantar fascia.  IMPRESSION:  1.  Abnormal increased inversion recovery weighted signal in the distal phalanx of the great toe with surrounding soft tissue defect, but without abnormal enhancement in the phalanx. Admittedly today's exam is limited by the prominent degree of motion artifact.  Infarct or distal phalangeal devascularization as a cause for lack of enhancement is raised as a possibility, although it seems unlikely that the T1 precontrast fatty marrow would be maintained in this situation.  Accordingly, the results are atypical and suspicious for but not entirely diagnostic of osteomyelitis in the  distal phalanx of the great toe. 2.  No drainable abscess observed.   Original Report Authenticated By: Gaylyn Rong, M.D.   Dg Chest Portable 1 View  10/13/2012   *RADIOLOGY REPORT*  Clinical  Data: Altered mental status.  Weakness.  History of diabetes and Alzheimer's disease.  PORTABLE CHEST - 1 VIEW  Comparison: Limited correlation is made with an abdominal CT 01/02/2011.  Findings: 1748 hours.  There are low lung volumes with left greater than right basilar air space opacities.  Heart size is normal. There is aortic atherosclerosis.  There may be a small amount of pleural fluid on the left.  Telemetry leads overlie the chest.  IMPRESSION: Patchy bibasilar air space opacities worrisome for aspiration pneumonia.   Original Report Authenticated By: Carey Bullocks, M.D.   Dg Foot Complete Right  10/19/2012   *RADIOLOGY REPORT*  Clinical Data: Wound infection  RIGHT FOOT COMPLETE - 3+ VIEW  Comparison: 09/17/2012  Findings: Three views of the right foot submitted.  Again noted diffuse osteopenia.  No acute fracture or subluxation.  Again note cortical irregularity at the tip of distal phalanx great toe. Osteomyelitis cannot be excluded.  Clinical correlation is necessary.  IMPRESSION: No acute fracture or subluxation.  Again note cortical irregularity at the tip of distal phalanx great toe.  Osteomyelitis cannot be excluded.  Clinical correlation is necessary.   Original Report Authenticated By: Natasha Mead, M.D.    Jeoffrey Massed, MD  [facility] Pager:[phone]  If 7PM-7AM, please contact night-coverage [email or web address] Password [email or web address] 10/22/2012, 9:58 AM   LOS: 3 days

## 2012-10-23 ENCOUNTER — Telehealth: Payer: Self-pay | Admitting: Vascular Surgery

## 2012-10-23 ENCOUNTER — Encounter (HOSPITAL_COMMUNITY): Payer: Self-pay | Admitting: Anesthesiology

## 2012-10-23 ENCOUNTER — Encounter (HOSPITAL_COMMUNITY)
Admission: EM | Disposition: A | Discharge status: Skilled Nursing Facility | Payer: Self-pay | Source: Home / Self Care | Attending: Internal Medicine

## 2012-10-23 ENCOUNTER — Inpatient Hospital Stay (HOSPITAL_COMMUNITY): Payer: Medicare (Managed Care) | Admitting: Anesthesiology

## 2012-10-23 HISTORY — PX: AMPUTATION: SHX166

## 2012-10-23 LAB — CBC
HCT: 31.9 % — ABNORMAL LOW (ref 36.0–46.0)
MCHC: 30.4 g/dL (ref 30.0–36.0)
MCV: 73 fL — ABNORMAL LOW (ref 78.0–100.0)
Platelets: 175 10*3/uL (ref 150–400)
RDW: 15.5 % (ref 11.5–15.5)
WBC: 9 10*3/uL (ref 4.0–10.5)

## 2012-10-23 LAB — GLUCOSE, CAPILLARY
Glucose-Capillary: 315 mg/dL — ABNORMAL HIGH (ref 70–99)
Glucose-Capillary: 191 mg/dL — ABNORMAL HIGH (ref 70–99)
Glucose-Capillary: 288 mg/dL — ABNORMAL HIGH (ref 70–99)

## 2012-10-23 LAB — BASIC METABOLIC PANEL
BUN: 6 mg/dL (ref 6–23)
CO2: 32 mEq/L (ref 19–32)
Calcium: 8.1 mg/dL — ABNORMAL LOW (ref 8.4–10.5)
Creatinine, Ser: 0.49 mg/dL — ABNORMAL LOW (ref 0.50–1.10)

## 2012-10-23 SURGERY — AMPUTATION, ABOVE KNEE
Anesthesia: General | Site: Leg Upper | Laterality: Right | Wound class: Clean

## 2012-10-23 MED ORDER — FENTANYL CITRATE 0.05 MG/ML IJ SOLN: 25.0000 ug | INTRAMUSCULAR | Status: DC | PRN

## 2012-10-23 MED ORDER — SODIUM CHLORIDE 0.9 % IV SOLN
INTRAVENOUS | Status: DC
  Administered 2012-10-23: 16:00:00 via INTRAVENOUS

## 2012-10-23 MED ORDER — DOCUSATE SODIUM 100 MG PO CAPS
100.0000 mg | ORAL_CAPSULE | Freq: Every day | ORAL | Status: DC
  Administered 2012-10-24 – 2012-10-26 (×3): 100 mg via ORAL
  Filled 2012-10-23 (×4): qty 1

## 2012-10-23 MED ORDER — PHENYLEPHRINE HCL 10 MG/ML IJ SOLN
INTRAMUSCULAR | Status: DC | PRN
  Administered 2012-10-23: 80 ug via INTRAVENOUS

## 2012-10-23 MED ORDER — 0.9 % SODIUM CHLORIDE (POUR BTL) OPTIME
TOPICAL | Status: DC | PRN
  Administered 2012-10-23: 1000 mL

## 2012-10-23 MED ORDER — LIDOCAINE HCL (CARDIAC) 20 MG/ML IV SOLN
INTRAVENOUS | Status: DC | PRN
  Administered 2012-10-23: 70 mg via INTRAVENOUS

## 2012-10-23 MED ORDER — PROPOFOL 10 MG/ML IV BOLUS
INTRAVENOUS | Status: DC | PRN
  Administered 2012-10-23: 90 mg via INTRAVENOUS

## 2012-10-23 MED ORDER — ONDANSETRON HCL 4 MG/2ML IJ SOLN
INTRAMUSCULAR | Status: DC | PRN
  Administered 2012-10-23: 4 mg via INTRAVENOUS

## 2012-10-23 MED ORDER — HYDROMORPHONE HCL PF 1 MG/ML IJ SOLN
0.5000 mg | INTRAMUSCULAR | Status: DC | PRN
  Administered 2012-10-23 (×2): 0.5 mg via INTRAVENOUS
  Administered 2012-10-24: 1 mg via INTRAVENOUS
  Filled 2012-10-23 (×3): qty 1

## 2012-10-23 MED ORDER — ARTIFICIAL TEARS OP OINT
TOPICAL_OINTMENT | OPHTHALMIC | Status: DC | PRN
  Administered 2012-10-23: 1 via OPHTHALMIC

## 2012-10-23 MED ORDER — ENOXAPARIN SODIUM 40 MG/0.4ML ~~LOC~~ SOLN
40.0000 mg | SUBCUTANEOUS | Status: DC
  Filled 2012-10-23 (×2): qty 0.4

## 2012-10-23 MED ORDER — LACTATED RINGERS IV SOLN
INTRAVENOUS | Status: DC | PRN
  Administered 2012-10-23 (×2): via INTRAVENOUS

## 2012-10-23 MED ORDER — FENTANYL CITRATE 0.05 MG/ML IJ SOLN
INTRAMUSCULAR | Status: DC | PRN
  Administered 2012-10-23 (×2): 25 ug via INTRAVENOUS
  Administered 2012-10-23: 50 ug via INTRAVENOUS
  Administered 2012-10-23: 25 ug via INTRAVENOUS

## 2012-10-23 MED ORDER — ONDANSETRON HCL 4 MG/2ML IJ SOLN: 4.0000 mg | Freq: Once | INTRAMUSCULAR | Status: DC | PRN

## 2012-10-23 MED ORDER — LACTATED RINGERS IV SOLN
Freq: Once | INTRAVENOUS | Status: AC
  Administered 2012-10-23: 09:00:00 via INTRAVENOUS

## 2012-10-23 SURGICAL SUPPLY — 46 items
BANDAGE ELASTIC 4 VELCRO ST LF (GAUZE/BANDAGES/DRESSINGS) ×2 IMPLANT
BANDAGE ELASTIC 6 VELCRO ST LF (GAUZE/BANDAGES/DRESSINGS) ×2 IMPLANT
BANDAGE ESMARK 6X9 LF (GAUZE/BANDAGES/DRESSINGS) IMPLANT
BANDAGE GAUZE ELAST BULKY 4 IN (GAUZE/BANDAGES/DRESSINGS) ×2 IMPLANT
BNDG ESMARK 6X9 LF (GAUZE/BANDAGES/DRESSINGS)
CANISTER SUCTION 2500CC (MISCELLANEOUS) ×2 IMPLANT
CLIP LIGATING EXTRA MED SLVR (CLIP) ×2 IMPLANT
CLIP LIGATING EXTRA SM BLUE (MISCELLANEOUS) ×2 IMPLANT
CLOTH BEACON ORANGE TIMEOUT ST (SAFETY) ×2 IMPLANT
COVER SURGICAL LIGHT HANDLE (MISCELLANEOUS) ×2 IMPLANT
CUFF TOURNIQUET SINGLE 34IN LL (TOURNIQUET CUFF) IMPLANT
CUFF TOURNIQUET SINGLE 44IN (TOURNIQUET CUFF) IMPLANT
DRAIN SNY 10X20 3/4 PERF (WOUND CARE) IMPLANT
DRAPE ORTHO SPLIT 77X108 STRL (DRAPES) ×2
DRAPE PROXIMA HALF (DRAPES) ×2 IMPLANT
DRAPE SURG ORHT 6 SPLT 77X108 (DRAPES) ×2 IMPLANT
ELECT REM PT RETURN 9FT ADLT (ELECTROSURGICAL) ×2
ELECTRODE REM PT RTRN 9FT ADLT (ELECTROSURGICAL) ×1 IMPLANT
EVACUATOR SILICONE 100CC (DRAIN) IMPLANT
GAUZE XEROFORM 5X9 LF (GAUZE/BANDAGES/DRESSINGS) ×2 IMPLANT
GLOVE BIOGEL PI IND STRL 7.0 (GLOVE) ×2 IMPLANT
GLOVE BIOGEL PI INDICATOR 7.0 (GLOVE) ×2
GLOVE SS BIOGEL STRL SZ 6.5 (GLOVE) ×1 IMPLANT
GLOVE SS BIOGEL STRL SZ 7.5 (GLOVE) ×1 IMPLANT
GLOVE SUPERSENSE BIOGEL SZ 6.5 (GLOVE) ×1
GLOVE SUPERSENSE BIOGEL SZ 7.5 (GLOVE) ×1
GLOVE SURG SS PI 7.5 STRL IVOR (GLOVE) ×4 IMPLANT
GOWN STRL NON-REIN LRG LVL3 (GOWN DISPOSABLE) ×8 IMPLANT
KIT BASIN OR (CUSTOM PROCEDURE TRAY) ×2 IMPLANT
KIT ROOM TURNOVER OR (KITS) ×2 IMPLANT
NS IRRIG 1000ML POUR BTL (IV SOLUTION) ×2 IMPLANT
PACK GENERAL/GYN (CUSTOM PROCEDURE TRAY) ×2 IMPLANT
PAD ARMBOARD 7.5X6 YLW CONV (MISCELLANEOUS) ×4 IMPLANT
PADDING CAST COTTON 6X4 STRL (CAST SUPPLIES) IMPLANT
SAW GIGLI STERILE 20 (MISCELLANEOUS) ×2 IMPLANT
SPONGE GAUZE 4X4 12PLY (GAUZE/BANDAGES/DRESSINGS) ×2 IMPLANT
STAPLER VISISTAT 35W (STAPLE) ×2 IMPLANT
STOCKINETTE IMPERVIOUS LG (DRAPES) ×2 IMPLANT
SUT ETHILON 3 0 PS 1 (SUTURE) IMPLANT
SUT VIC AB 0 CT1 18XCR BRD 8 (SUTURE) ×2 IMPLANT
SUT VIC AB 0 CT1 8-18 (SUTURE) ×2
SUT VICRYL AB 2 0 TIES (SUTURE) ×2 IMPLANT
TOWEL OR 17X24 6PK STRL BLUE (TOWEL DISPOSABLE) ×4 IMPLANT
TOWEL OR 17X26 10 PK STRL BLUE (TOWEL DISPOSABLE) ×2 IMPLANT
UNDERPAD 30X30 INCONTINENT (UNDERPADS AND DIAPERS) ×2 IMPLANT
WATER STERILE IRR 1000ML POUR (IV SOLUTION) ×2 IMPLANT

## 2012-10-23 NOTE — Progress Notes (Signed)
PATIENT DETAILS Name: Emily Gibbs Age: 79 y.o. Sex: female Date of Birth: 01-14-1932 Admit Date: 10/19/2012 Admitting Physician Hollice Espy, MD PCP:Pcp Not In System  Subjective: No major issues overnight-family agreeable for AKA  Assessment/Plan: Cellulitis/Gangrene of Right Great Toe  -Dry gangrene of distal tip of right great toe with cellulitis moving proximally about 3 inches into the dorsum of the foot; currently no drainage noted. -Appreciate VVS input-family agreeable with AKA.  In surgery 7/7 -Continue Zosyn day 5, Vanco day 4- will continue for today-and then stop from 7/8  Underlying PVD  -per daughter at bedside-has stents in the bilateral lower ext  -c/w Anti-platelet agents and Statins -Right AKA 7/7 with Dr. Tawanna Cooler Early  DM -CBG's with moderate control -c/w SSI  Dementia  -history of chronic Alzheimer's dementia with a decline in responsiveness over the last few weeks  -continue memantine 10 mg po BID, and donepezil 10 mg po QHS  GERD  -seems to be stable  -continue to manage with home meds - pantoprazole 80 mg po daily   Deconditioning  -Per Daughter, patient has been bedbound for close to 2 months. Prior to that she was ambulating with some assistance. Will get physical therapy evaluation post op. Poor overall prognosis-daughter aware  Microcytic anemia Will assess to determine if patient needs transfusion after AKA. Likely a component of anemia of chronic disease in this patient with dementia and parkinsons. Stable for outpatient work up if deemed appropriate.  Disposition: -Remain inpatient.  Mixed signals from Family  - SNF vs Home with home health when appropriate.  Case mgmt / Social work both consulted.  DVT Prophylaxis: Prophylactic Lovenox   Code Status:  DNR  Family Communication Daughter at bedside  Procedures:  ABI 7/4  CONSULTS:  vascular surgery   MEDICATIONS: Scheduled Meds: . [facility] HOLD] aspirin  81 mg Oral Daily   . [facility] HOLD] cilostazol  100 mg Oral BID  . [facility] HOLD] citalopram  15 mg Oral QHS  . [facility] HOLD] clopidogrel  75 mg Oral q morning - 10a  . [MAR HOLD] divalproex  500 mg Oral QHS  . [MAR HOLD] donepezil  10 mg Oral QHS  . [MAR HOLD] enoxaparin (LOVENOX) injection  40 mg Subcutaneous Q24H  . [facility] HOLD] furosemide  20 mg Oral Daily  . [MAR HOLD] insulin aspart  0-15 Units Subcutaneous TID WC  . [MAR HOLD] insulin aspart  0-5 Units Subcutaneous QHS  . [MAR HOLD] memantine  10 mg Oral BID  . [facility] HOLD] pantoprazole  80 mg Oral Q1200  . [MAR HOLD] piperacillin-tazobactam (ZOSYN)  IV  3.375 g Intravenous Q8H  . [facility] HOLD] simvastatin  40 mg Oral QPM  . [MAR HOLD] sodium chloride  3 mL Intravenous Q12H  . [facility] HOLD] traZODone  150 mg Oral QHS  . [facility] HOLD] vancomycin  1,000 mg Intravenous Q24H   Continuous Infusions:  PRN Meds:.[[facility] HOLD] sodium chloride, 0.9 % irrigation (POUR BTL), [MAR HOLD] acetaminophen, [MAR HOLD] acetaminophen, [MAR HOLD] ondansetron (ZOFRAN) IV, [MAR HOLD] ondansetron, [MAR HOLD] oxyCODONE, [MAR HOLD] sodium chloride  Antibiotics: Anti-infectives   Start     Dose/Rate Route Frequency Ordered Stop   10/20/12 1400  [MAR Hold]  piperacillin-tazobactam (ZOSYN) IVPB 3.375 g     (On MAR Hold since 10/23/12 0902)   3.375 g 12.5 mL/hr over 240 Minutes Intravenous 3 times per day 10/20/12 1204     10/20/12 1230  [MAR Hold]  vancomycin (VANCOCIN) IVPB 1000 mg/200 mL premix     (  On MAR Hold since 10/23/12 0902)   1,000 mg 200 mL/hr over 60 Minutes Intravenous Every 24 hours 10/20/12 1204     10/19/12 1415  piperacillin-tazobactam (ZOSYN) IVPB 3.375 g     3.375 g 12.5 mL/hr over 240 Minutes Intravenous  Once 10/19/12 1407 10/19/12 1635   10/19/12 1215  clindamycin (CLEOCIN) capsule 300 mg     300 mg Oral  Once 10/19/12 1202 10/19/12 1340       PHYSICAL EXAM: Vital signs in last 24 hours: Filed Vitals:   10/22/12 0520 10/22/12 1357 10/22/12 2038 10/23/12 0512  BP:  106/67 119/69 136/78 130/80  Pulse: 92 92 94 94  Temp: 97.9 F (36.6 C) 98.4 F (36.9 C) 97.9 F (36.6 C) 97.9 F (36.6 C)  TempSrc: Oral Oral Oral Oral  Resp: 18 16 18 18   Height:      Weight:      SpO2: 93% 92% 95% 87%    Weight change:  Filed Weights   10/19/12 1841  Weight: 67.903 kg (149 lb 11.2 oz)   Body mass index is 28.3 kg/(m^2).   Gen Exam: Patient does not awake from sleep.  10 family members in the room. HEENT:  Left pupil greater in size than right. MM dry Neck: Supple, No JVD.   Chest: B/L Clear.   CVS: S1 S2 Regular, 3/6 systolic murmur +.  Abdomen: soft, BS +, non tender, non distended.  Extremities: no edema, lower extremities warm to touch.Gangrene of entire right first toe with some erythema proximally both anteriorly and posteriorly. Abrasions on right pretibial area from recent fall. Neurologic: unable to assess  Intake/Output from previous day:  Intake/Output Summary (Last 24 hours) at 10/23/12 0950 Last data filed at 10/22/12 1858  Gross per 24 hour  Intake    480 ml  Output      0 ml  Net    480 ml     LAB RESULTS: CBC  Recent Labs Lab 10/19/12 1243 10/19/12 1950 10/20/12 0520 10/21/12 0448 10/23/12 0445  WBC 11.0* 11.5* 8.4 9.3 9.0  HGB 10.9* 11.7* 10.6* 10.5* 9.7*  HCT 35.4* 37.8 33.6* 34.0* 31.9*  PLT 222 196 149* 187 175  MCV 73.6* 73.0* 72.6* 72.3* 73.0*  MCH 22.7* 22.6* 22.9* 22.3* 22.2*  MCHC 30.8 31.0 31.5 30.9 30.4  RDW 15.7* 15.8* 15.7* 15.6* 15.5  LYMPHSABS 2.8  --   --   --   --   MONOABS 1.2*  --   --   --   --   EOSABS 0.2  --   --   --   --   BASOSABS 0.0  --   --   --   --     Chemistries   Recent Labs Lab 10/19/12 1243 10/19/12 1950 10/20/12 0520 10/21/12 0448 10/23/12 0445  NA 140  --  138 138 137  K 2.9* 4.0 4.3 3.6 3.7  CL 100  --  102 100 98  CO2 33*  --  28 32 32  GLUCOSE 265*  --  177* 215* 262*  BUN 8  --  7 5* 6  CREATININE 0.51 0.48* 0.38* 0.45* 0.49*  CALCIUM 8.2*  --  8.2* 8.4 8.1*     CBG:  Recent Labs Lab 10/22/12 0745 10/22/12 1150 10/22/12 1653 10/22/12 2219 10/23/12 0727  GLUCAP 202* 274* 265* 312* 199*    MICROBIOLOGY: Recent Results (from the past 240 hour(s))  URINE CULTURE     Status: None   Collection  Time    10/13/12  6:09 PM      Result Value Range Status   Specimen Description URINE, CATHETERIZED   Final   Special Requests NONE   Final   Culture  Setup Time 10/13/2012 22:56   Final   Colony Count >=100,000 COLONIES/ML   Final   Culture     Final   Value: DIPHTHEROIDS(CORYNEBACTERIUM SPECIES)     Note: Standardized susceptibility testing for this organism is not available.   Report Status 10/14/2012 FINAL   Final  SURGICAL PCR SCREEN     Status: None   Collection Time    10/22/12  2:32 PM      Result Value Range Status   MRSA, PCR NEGATIVE  NEGATIVE Final   Staphylococcus aureus NEGATIVE  NEGATIVE Final   Comment:            The Xpert SA Assay (FDA     approved for NASAL specimens     in patients over 10 years of age),     is one component of     a comprehensive surveillance     program.  Test performance has     been validated by [employer] for patients greater     than or equal to 52 year old.     It is not intended     to diagnose infection nor to     guide or monitor treatment.    RADIOLOGY STUDIES/RESULTS: Ct Head Wo Contrast  10/13/2012   *RADIOLOGY REPORT*  Clinical Data: Altered mental status.  Slurred speech.  Remote history breast cancer.  CT HEAD WITHOUT CONTRAST  Technique:  Contiguous axial images were obtained from the base of the skull through the vertex without contrast.  Comparison: None.  Findings: Remote appearing right occipital lobe infarct noted. Faint calcification along the left globus pallidus nucleus is likely physiologic.  There is chronic microvascular white matter disease.  There is more confluent white matter hypodensity in the left parietal lobe extending to the attenuated left parietal cortex,  potentially from chronic or subacute stroke small chronic-appearing left periventricular white matter lacunar infarct noted.  No intracranial hemorrhage or discrete mass lesion is identified. Homogeneous abnormal high density in left globe, query prior injected silicone oil.  There is mild frothy material in the left sphenoid sinus.  IMPRESSION:  1.  Subacute or chronic left high parietal infarct.  Remote right occipital lobe infarct. 2. Periventricular and corona radiata white matter hypodensities are most compatible with chronic ischemic microvascular white matter disease. 3.  Very homogeneous abnormal high density in left globe.  Although hemorrhage and other conditions can cause this appearance, correlate with any history of injected silicone oil in the globe. 4.  Chronic left sphenoid sinusitis.   Original Report Authenticated By: Gaylyn Rong, M.D.   Mr Foot Left W Wo Contrast  10/20/2012   *RADIOLOGY REPORT*  Clinical Data: Cellulitis and gangrenous left toe.  Diabetes. Query osteomyelitis.  MRI OF THE LEFT FOREFOOT WITHOUT AND WITH CONTRAST  Technique:  Multiplanar, multisequence MR imaging was performed both before and after administration of intravenous contrast.  Contrast: 15mL MULTIHANCE GADOBENATE DIMEGLUMINE 529 MG/ML IV SOLN  Comparison: 07/30/2011  Findings: Despite efforts by the patient and technologist, motion artifact is present on some series of today's examination and could not be totally eliminated.  This reduces diagnostic sensitivity and specificity.  There appears to be reduction of soft tissue in the great toe around the distal half  of the distal phalanx concerning for tissue necrosis/ulceration on image 4 of series 10 and image 16 of series 9 there appears to be increased signal in the distal phalanx on inversion recovery weighted images.  However, no overt enhancement is identified in the bone.  No drainable abscess observed. Plantar muscular atrophy noted with low-level edema  tracking along the plantar fascia.  IMPRESSION:  1.  Abnormal increased inversion recovery weighted signal in the distal phalanx of the great toe with surrounding soft tissue defect, but without abnormal enhancement in the phalanx. Admittedly today's exam is limited by the prominent degree of motion artifact.  Infarct or distal phalangeal devascularization as a cause for lack of enhancement is raised as a possibility, although it seems unlikely that the T1 precontrast fatty marrow would be maintained in this situation.  Accordingly, the results are atypical and suspicious for but not entirely diagnostic of osteomyelitis in the distal phalanx of the great toe. 2.  No drainable abscess observed.   Original Report Authenticated By: Gaylyn Rong, M.D.   Dg Chest Portable 1 View  10/13/2012   *RADIOLOGY REPORT*  Clinical Data: Altered mental status.  Weakness.  History of diabetes and Alzheimer's disease.  PORTABLE CHEST - 1 VIEW  Comparison: Limited correlation is made with an abdominal CT 01/02/2011.  Findings: 1748 hours.  There are low lung volumes with left greater than right basilar air space opacities.  Heart size is normal. There is aortic atherosclerosis.  There may be a small amount of pleural fluid on the left.  Telemetry leads overlie the chest.  IMPRESSION: Patchy bibasilar air space opacities worrisome for aspiration pneumonia.   Original Report Authenticated By: Carey Bullocks, M.D.   Dg Foot Complete Right  10/19/2012   *RADIOLOGY REPORT*  Clinical Data: Wound infection  RIGHT FOOT COMPLETE - 3+ VIEW  Comparison: 09/17/2012  Findings: Three views of the right foot submitted.  Again noted diffuse osteopenia.  No acute fracture or subluxation.  Again note cortical irregularity at the tip of distal phalanx great toe. Osteomyelitis cannot be excluded.  Clinical correlation is necessary.  IMPRESSION: No acute fracture or subluxation.  Again note cortical irregularity at the tip of distal phalanx  great toe.  Osteomyelitis cannot be excluded.  Clinical correlation is necessary.   Original Report Authenticated By: Natasha Mead, M.D.    [place] [facility] Pager:[phone]  If 7PM-7AM, please contact night-coverage [email or web address] Password [email or web address] 10/23/2012, 9:50 AM   LOS: 4 days    Attending Patient seen and examined, agree with the above assessment and plan. Above documentation reviewed, necessary edits were made. She is supposed to undergo a right AKA later today. Likely would need SNF on discharge.  Windell Norfolk MD

## 2012-10-23 NOTE — Anesthesia Postprocedure Evaluation (Signed)
Anesthesia Post Note  Patient: Emily Gibbs  Procedure(s) Performed: Procedure(s) (LRB): AMPUTATION ABOVE KNEE (Right)  Anesthesia type: General  Patient location: PACU  Post pain: Pain level controlled and Adequate analgesia  Post assessment: Post-op Vital signs reviewed, Patient's Cardiovascular Status Stable, Respiratory Function Stable, Patent Airway and Pain level controlled  Last Vitals:  Filed Vitals:   10/23/12 1215  BP: 122/67  Pulse: 80  Temp: 36.8 C  Resp: 15    Post vital signs: Reviewed and stable  Level of consciousness: awake, alert  and oriented  Complications: No apparent anesthesia complications

## 2012-10-23 NOTE — Transfer of Care (Signed)
Immediate Anesthesia Transfer of Care Note  Patient: Emily Gibbs  Procedure(s) Performed: Procedure(s): AMPUTATION ABOVE KNEE (Right)  Patient Location: PACU  Anesthesia Type:General  Level of Consciousness: awake and patient cooperative  Airway & Oxygen Therapy: Patient Spontanous Breathing and Patient connected to nasal cannula oxygen  Post-op Assessment: Report given to PACU RN and Post -op Vital signs reviewed and stable  Post vital signs: Reviewed and stable  Complications: No apparent anesthesia complications

## 2012-10-23 NOTE — Preoperative (Signed)
Beta Blockers   Reason not to administer Beta Blockers:Not Applicable 

## 2012-10-23 NOTE — Anesthesia Preprocedure Evaluation (Addendum)
Anesthesia Evaluation  Patient identified by MRN, date of birth, ID band Patient awake    Reviewed: Allergy & Precautions, H&P , NPO status , Patient's Chart, lab work & pertinent test results, reviewed documented beta blocker date and time   Airway Mallampati: II TM Distance: >3 FB Neck ROM: full    Dental  (+) Edentulous Upper, Edentulous Lower and Dental Advisory Given   Pulmonary          Cardiovascular + Peripheral Vascular Disease + Valvular Problems/Murmurs AS     Neuro/Psych Parkinson's dz    GI/Hepatic GERD-  Controlled,  Endo/Other  diabetes, Well Controlled, Type 2, Insulin Dependent  Renal/GU      Musculoskeletal   Abdominal   Peds  Hematology   Anesthesia Other Findings   Reproductive/Obstetrics                          Anesthesia Physical Anesthesia Plan  ASA: III  Anesthesia Plan: General   Post-op Pain Management:    Induction: Intravenous  Airway Management Planned: LMA  Additional Equipment:   Intra-op Plan:   Post-operative Plan:   Informed Consent: I have reviewed the patients History and Physical, chart, labs and discussed the procedure including the risks, benefits and alternatives for the proposed anesthesia with the patient or authorized representative who has indicated his/her understanding and acceptance.     Plan Discussed with: CRNA, Anesthesiologist and Surgeon  Anesthesia Plan Comments:         Anesthesia Quick Evaluation

## 2012-10-23 NOTE — Progress Notes (Signed)
ANTIBIOTIC CONSULT NOTE - FOLLOW UP  Pharmacy Consult:  Vancomycin Indication:  Cellulitis / gangrenous right great toe  No Known Allergies  Patient Measurements: Height: 5\' 1"  (154.9 cm) Weight: 149 lb 11.2 oz (67.903 kg) IBW/kg (Calculated) : 47.8  Vital Signs: Temp: 97.9 F (36.6 C) (07/07 0512) Temp src: Oral (07/07 0512) BP: 130/80 mmHg (07/07 0512) Pulse Rate: 94 (07/07 0512) Intake/Output from previous day: 07/06 0701 - 07/07 0700 In: 480 [P.O.:480] Out: -   Labs:  Recent Labs  10/21/12 0448 10/23/12 0445  WBC 9.3 9.0  HGB 10.5* 9.7*  PLT 187 175  CREATININE 0.45* 0.49*   Estimated Creatinine Clearance: 50.2 ml/min (by C-G formula based on Cr of 0.49). No results found for this basename: VANCOTROUGH, Leodis Binet, VANCORANDOM, GENTTROUGH, GENTPEAK, GENTRANDOM, TOBRATROUGH, TOBRAPEAK, TOBRARND, AMIKACINPEAK, AMIKACINTROU, AMIKACIN,  in the last 72 hours   Microbiology: Recent Results (from the past 720 hour(s))  URINE CULTURE     Status: None   Collection Time    10/13/12  6:09 PM      Result Value Range Status   Specimen Description URINE, CATHETERIZED   Final   Special Requests NONE   Final   Culture  Setup Time 10/13/2012 22:56   Final   Colony Count >=100,000 COLONIES/ML   Final   Culture     Final   Value: DIPHTHEROIDS(CORYNEBACTERIUM SPECIES)     Note: Standardized susceptibility testing for this organism is not available.   Report Status 10/14/2012 FINAL   Final  SURGICAL PCR SCREEN     Status: None   Collection Time    10/22/12  2:32 PM      Result Value Range Status   MRSA, PCR NEGATIVE  NEGATIVE Final   Staphylococcus aureus NEGATIVE  NEGATIVE Final   Comment:            The Xpert SA Assay (FDA     approved for NASAL specimens     in patients over 19 years of age),     is one component of     a comprehensive surveillance     program.  Test performance has     been validated by [employer] for patients greater     than or equal to 8  year old.     It is not intended     to diagnose infection nor to     guide or monitor treatment.       Assessment: 79 y/o female patient admitted with right foot cellulitis and started on vancomycin and Zosyn.  MRI suspicious for osteomyelitis but not diagnostic.  Patient's renal function has been stable.  Noted plan for AKA.  Vanc 7/4 >> Zosyn 7/3 >>  7/6 MRSA PCR - negative   Goal of Therapy:  Vancomycin trough level 15-20 mcg/ml   Plan:  - Vanc 1gm IV Q24H - Zosyn 3.375gm IV Q8H, 4 hr infusion - Monitor renal fxn, clinical course, vanc trough as indicated - F/U insulin adjustment post procedure for better glycemic control    Twain Stenseth D. Laney Potash, PharmD, BCPS Pager:  [phone] 10/23/2012, 8:33 AM

## 2012-10-23 NOTE — Anesthesia Procedure Notes (Signed)
Procedure Name: LMA Insertion Date/Time: 10/23/2012 9:59 AM Performed by: Darcey Nora B Pre-anesthesia Checklist: Patient identified, Patient being monitored, Emergency Drugs available and Suction available Patient Re-evaluated:Patient Re-evaluated prior to inductionOxygen Delivery Method: Circle system utilized Preoxygenation: Pre-oxygenation with 100% oxygen Ventilation: Mask ventilation without difficulty LMA: LMA inserted LMA Size: 4.0 Placement Confirmation: breath sounds checked- equal and bilateral and positive ETCO2 ETT to lip (cm): taped across cheeks. Tube secured with: Tape Dental Injury: Teeth and Oropharynx as per pre-operative assessment

## 2012-10-23 NOTE — Progress Notes (Signed)
Inpatient Diabetes Program Recommendations  AACE/ADA: New Consensus Statement on Inpatient Glycemic Control (2013)  Target Ranges:  Prepandial:   less than 140 mg/dL      Peak postprandial:   less than 180 mg/dL (1-2 hours)      Critically ill patients:  140 - 180 mg/dL   Results for [name], [name] (MRN [id number]) as of 10/23/2012 12:04  Ref. Range 10/22/2012 07:45 10/22/2012 11:50 10/22/2012 16:53 10/22/2012 22:19 10/23/2012 07:27 10/23/2012 11:24  Glucose-Capillary Latest Range: 70-99 mg/dL 409 (H) 811 (H) 914 (H) 312 (H) 199 (H) 153 (H)    Inpatient Diabetes Program Recommendations Correction (SSI): Please consider increasing Novolog correction to resistant scale. Insulin - Meal Coverage: May want to order Novolog 3 units TID with meals for meal coverage once diet is resumed.  Note: Blood glucose over the past 30 hours has ranged from 153-312 mg/dl.  Noted that patient is NPO for surgery today.  Please increase Novolog correction to resistant scale and may want to order Novolog 3 units TID with meals for meal coverage once diet is resumed.  Will continue to follow.  Thanks, Orlando Penner, RN, MSN, CCRN Diabetes Coordinator Inpatient Diabetes Program [phone]

## 2012-10-23 NOTE — Telephone Encounter (Signed)
lvm re appt info, sent letter - kf °

## 2012-10-23 NOTE — Progress Notes (Signed)
New ace wrap has been applied to extremity. Pt's bleeding has decreased. Leg has been elevated on pillow. rn will continue to monitor pt's bleeding

## 2012-10-23 NOTE — Progress Notes (Signed)
Utilization review completed. Kyana Aicher, RN, BSN. 

## 2012-10-23 NOTE — Op Note (Signed)
OPERATIVE REPORT  DATE OF SURGERY: 10/23/2012  PATIENT: Emily Gibbs, 79 y.o. female MRN: [id number]  DOB: 07-08-1931  PRE-OPERATIVE DIAGNOSIS: Gangrene right foot  POST-OPERATIVE DIAGNOSIS:  Same  PROCEDURE: Right above-knee amputation  SURGEON:  Gretta Began, M.D.  PHYSICIAN ASSISTANT: Roczniak  ANESTHESIA:  Gen.  EBL: 250 ml  Total I/O In: 1400 [I.V.:1400] Out: 250 [Blood:250]  BLOOD ADMINISTERED: None  DRAINS: None  SPECIMEN: Right above-knee amputation specimen  COUNTS CORRECT:  YES  PLAN OF CARE: PACU   PATIENT DISPOSITION:  PACU - hemodynamically stable  PROCEDURE DETAILS: The patient was taken to the operating room placed supine position where the area of the right calf knee and groin were prepped and draped in usual sterile fashion a fishmouth incision was made above the level of the knee. This was carried down through the subcutaneous fat and fascia and muscle with electrocautery. The ligated with 0 Vicryl ties and divided. The dissection was continued down to the neurovascular bundle. The superficial femoral artery and vein were individually occluded with hemostats and divided. There was a stent at this level of the suprasternal artery and it was removed to allow closure of the artery. The artery and vein were ligated with 0 Vicryl ties. The sciatic nerve was identified and was ligated proximally and divided. Periosteum was mobilized off the femur and the femur was divided several fingerbreadths above the level of the skin incision. This was done with a Gigli saw. Then edge was smoothed with a bone rasp. The specimen was passed off the field. The wounds were irrigated with saline and hemostasis was obtained with electrocautery. The anterior fascia was closed to the posterior fascia with interrupted 0 Vicryl figure-of-eight sutures. The wound was again irrigated with saline and hemostasis obtained with cautery. A skin incision was closed with skin clips. Iodoform gauze 2  x 4 gauze and Kerlix and six-inch Ace wrap were then placed over the stump. This was secured to the skin with tape. The patient was transferred to the recovery room in stable condition.   Gretta Began, M.D. 10/23/2012 12:46 PM

## 2012-10-23 NOTE — OR Nursing (Signed)
preop assessment performed by T.Marshall, RNFA.

## 2012-10-23 NOTE — Progress Notes (Signed)
Dressing to operative site was reinforced. Pt still bleeding from site.

## 2012-10-23 NOTE — H&P (View-Only) (Signed)
Patient ID: Emily Gibbs, female   DOB: 06/18/1931, 79 y.o.   MRN: [id number] Vascular Surgery Progress Note  Subjective: Gangrene right first toe with severe diffuse vascular occlusive disease and nonambulatory patient with dementia-needs right AKA. Daughter is present today and is agreeable to proceeding tomorrow with right AKA  Objective:  Filed Vitals:   10/22/12 0520  BP: 106/67  Pulse: 92  Temp: 97.9 F (36.6 C)  Resp: 18    General alert but does not answer questions Right lower turbinate with dressing on right foot-dry   Labs:  Recent Labs Lab 10/19/12 1950 10/20/12 0520 10/21/12 0448  CREATININE 0.48* 0.38* 0.45*    Recent Labs Lab 10/19/12 1243 10/19/12 1950 10/20/12 0520 10/21/12 0448  NA 140  --  138 138  K 2.9* 4.0 4.3 3.6  CL 100  --  102 100  CO2 33*  --  28 32  BUN 8  --  7 5*  CREATININE 0.51 0.48* 0.38* 0.45*  GLUCOSE 265*  --  177* 215*  CALCIUM 8.2*  --  8.2* 8.4    Recent Labs Lab 10/19/12 1950 10/20/12 0520 10/21/12 0448  WBC 11.5* 8.4 9.3  HGB 11.7* 10.6* 10.5*  HCT 37.8 33.6* 34.0*  PLT 196 149* 187   No results found for this basename: INR,  in the last 168 hours  I/O last 3 completed shifts: In: 180 [P.O.:180] Out: -   Imaging: Mr Foot Left W Wo Contrast  10/20/2012   *RADIOLOGY REPORT*  Clinical Data: Cellulitis and gangrenous left toe.  Diabetes. Query osteomyelitis.  MRI OF THE LEFT FOREFOOT WITHOUT AND WITH CONTRAST  Technique:  Multiplanar, multisequence MR imaging was performed both before and after administration of intravenous contrast.  Contrast: 15mL MULTIHANCE GADOBENATE DIMEGLUMINE 529 MG/ML IV SOLN  Comparison: 07/30/2011  Findings: Despite efforts by the patient and technologist, motion artifact is present on some series of today's examination and could not be totally eliminated.  This reduces diagnostic sensitivity and specificity.  There appears to be reduction of soft tissue in the great toe around the distal  half of the distal phalanx concerning for tissue necrosis/ulceration on image 4 of series 10 and image 16 of series 9 there appears to be increased signal in the distal phalanx on inversion recovery weighted images.  However, no overt enhancement is identified in the bone.  No drainable abscess observed. Plantar muscular atrophy noted with low-level edema tracking along the plantar fascia.  IMPRESSION:  1.  Abnormal increased inversion recovery weighted signal in the distal phalanx of the great toe with surrounding soft tissue defect, but without abnormal enhancement in the phalanx. Admittedly today's exam is limited by the prominent degree of motion artifact.  Infarct or distal phalangeal devascularization as a cause for lack of enhancement is raised as a possibility, although it seems unlikely that the T1 precontrast fatty marrow would be maintained in this situation.  Accordingly, the results are atypical and suspicious for but not entirely diagnostic of osteomyelitis in the distal phalanx of the great toe. 2.  No drainable abscess observed.   Original Report Authenticated By: Walter Liebkemann, M.D.    Assessment/Plan:    LOS: 3 days  s/p Procedure(s): AMPUTATION ABOVE KNEE  Discuss situation at length with patient's daughter and family is agreeable to proceeding with right AKA tomorrow.  Scheduled for Dr. early tomorrow a.m.   Emily Ignasiak, MD 10/22/2012 8:48 AM           

## 2012-10-23 NOTE — Telephone Encounter (Signed)
Message copied by Margaretmary Eddy on Mon Oct 23, 2012 11:46 AM ------      Message from: Marlowe Shores      Created: Mon Oct 23, 2012 11:15 AM       4 week F/U BKA - Early ------

## 2012-10-23 NOTE — Progress Notes (Signed)
Pt is still having some bleeding from the surgical site. rn paged Md on call for Dr. Arbie Cookey and is awaiting further orders.

## 2012-10-23 NOTE — Interval H&P Note (Signed)
History and Physical Interval Note:  10/23/2012 9:08 AM  Emily Gibbs  has presented today for surgery, with the diagnosis of /  The various methods of treatment have been discussed with the patient and family. After consideration of risks, benefits and other options for treatment, the patient has consented to  Procedure(s): AMPUTATION ABOVE KNEE (Right) as a surgical intervention .  The patient's history has been reviewed, patient examined, no change in status, stable for surgery.  I have reviewed the patient's chart and labs.  Questions were answered to the patient's satisfaction.     [name]

## 2012-10-24 ENCOUNTER — Encounter (HOSPITAL_COMMUNITY): Payer: Self-pay | Admitting: Vascular Surgery

## 2012-10-24 DIAGNOSIS — I739 Peripheral vascular disease, unspecified: Secondary | ICD-10-CM

## 2012-10-24 DIAGNOSIS — L98499 Non-pressure chronic ulcer of skin of other sites with unspecified severity: Secondary | ICD-10-CM

## 2012-10-24 DIAGNOSIS — S78119A Complete traumatic amputation at level between unspecified hip and knee, initial encounter: Secondary | ICD-10-CM

## 2012-10-24 LAB — BASIC METABOLIC PANEL
BUN: 6 mg/dL (ref 6–23)
Chloride: 99 mEq/L (ref 96–112)
GFR calc Af Amer: >90 mL/min (ref >90)
Glucose, Bld: 231 mg/dL — ABNORMAL HIGH (ref 70–99)
Potassium: 3.5 mEq/L (ref 3.5–5.1)
Sodium: 138 mEq/L (ref 135–145)

## 2012-10-24 LAB — CBC
HCT: 26.1 % — ABNORMAL LOW (ref 36.0–46.0)
HCT: 31.6 % — ABNORMAL LOW (ref 36.0–46.0)
Hemoglobin: 7.8 g/dL — ABNORMAL LOW (ref 12.0–15.0)
Hemoglobin: 10 g/dL — ABNORMAL LOW (ref 12.0–15.0)
MCH: 23.6 pg — ABNORMAL LOW (ref 26.0–34.0)
MCHC: 31.6 g/dL (ref 30.0–36.0)
MCV: 74.7 fL — ABNORMAL LOW (ref 78.0–100.0)
RDW: 15.8 % — ABNORMAL HIGH (ref 11.5–15.5)
RDW: 16.3 % — ABNORMAL HIGH (ref 11.5–15.5)
WBC: 11.5 10*3/uL — ABNORMAL HIGH (ref 4.0–10.5)

## 2012-10-24 LAB — PREPARE RBC (CROSSMATCH)

## 2012-10-24 LAB — GLUCOSE, CAPILLARY: Glucose-Capillary: 241 mg/dL — ABNORMAL HIGH (ref 70–99)

## 2012-10-24 MED ORDER — INSULIN ASPART 100 UNIT/ML ~~LOC~~ SOLN: 0.0000 [IU] | Freq: Three times a day (TID) | SUBCUTANEOUS | Status: DC

## 2012-10-24 MED ORDER — OXYCODONE HCL 5 MG PO TABS
5.0000 mg | ORAL_TABLET | ORAL | Status: DC | PRN
  Administered 2012-10-25 – 2012-10-27 (×6): 10 mg via ORAL
  Filled 2012-10-24 (×6): qty 2

## 2012-10-24 MED ORDER — INSULIN ASPART 100 UNIT/ML ~~LOC~~ SOLN
0.0000 [IU] | Freq: Three times a day (TID) | SUBCUTANEOUS | Status: DC
  Administered 2012-10-24: 7 [IU] via SUBCUTANEOUS
  Administered 2012-10-24: 15 [IU] via SUBCUTANEOUS
  Administered 2012-10-24 – 2012-10-25 (×3): 11 [IU] via SUBCUTANEOUS

## 2012-10-24 MED ORDER — HYDROMORPHONE HCL PF 1 MG/ML IJ SOLN
0.5000 mg | INTRAMUSCULAR | Status: DC | PRN
  Administered 2012-10-24 – 2012-10-25 (×2): 0.5 mg via INTRAVENOUS
  Filled 2012-10-24 (×2): qty 1

## 2012-10-24 MED ORDER — ACETAMINOPHEN 325 MG PO TABS: 650.0000 mg | ORAL_TABLET | Freq: Four times a day (QID) | ORAL | Status: AC | PRN

## 2012-10-24 MED ORDER — INSULIN GLARGINE 100 UNIT/ML ~~LOC~~ SOLN
10.0000 [IU] | Freq: Every day | SUBCUTANEOUS | Status: DC
  Administered 2012-10-24: 10 [IU] via SUBCUTANEOUS
  Filled 2012-10-24 (×2): qty 0.1

## 2012-10-24 MED ORDER — OXYCODONE HCL 5 MG PO TABS: 5.0000 mg | ORAL_TABLET | Freq: Four times a day (QID) | ORAL | Status: DC | PRN

## 2012-10-24 MED ORDER — HEPARIN SODIUM (PORCINE) 5000 UNIT/ML IJ SOLN
5000.0000 [IU] | Freq: Three times a day (TID) | INTRAMUSCULAR | Status: DC
  Filled 2012-10-24 (×3): qty 1

## 2012-10-24 MED ORDER — ENOXAPARIN SODIUM 30 MG/0.3ML ~~LOC~~ SOLN
40.0000 mg | SUBCUTANEOUS | Status: DC
  Administered 2012-10-24 – 2012-10-25 (×2): 40 mg via SUBCUTANEOUS
  Filled 2012-10-24 (×4): qty 0.4

## 2012-10-24 NOTE — Clinical Documentation Improvement (Signed)
THIS DOCUMENT IS NOT A PERMANENT PART OF THE MEDICAL RECORD  Please update your documentation with the medical record to reflect your response to this query. If you need help knowing how to do this please call [phone].  10/24/12  Dear Dr. Arbie Cookey Marton Redwood  In an effort to better capture your patient's severity of illness, reflect appropriate length of stay and utilization of resources, a review of the patient medical record has revealed the following: postop anemia    Based on your clinical judgment, please clarify and document in progress note and discharge summary the clinical condition associated with the following supporting information:   Hemoglobin/Hematocrit has dropped from 11.7 / 37.8 on admit -> 10.5 / 34 -> 7.8 / 26.1 after surgery  2 units packed cells given  1. Please clarify the type of postop anemia : Acute hemorrhagic anemia                 Acute blood loss anemia                 Chronic blood loss anemia                 Other/Unspecified anemia  2. Also, if clinically significant, please clarify if you feel Osteomyelitis: ruled in or ruled out     In responding to this query please exercise your independent judgment.  The fact that a query is asked, does not imply that any particular answer is desired or expected.     Consult noted abla and that OM ruled out.  Thank You,  Shellee Milo  Clinical Documentation Specialist: [phone] Health Information Management 

## 2012-10-24 NOTE — Evaluation (Signed)
Occupational Therapy Evaluation Patient Details Name: Emily Gibbs MRN: [id number] DOB: 14-Oct-1931 Today's Date: 10/24/2012 Time: 4098-1191 OT Time Calculation (min): 43 min  OT Assessment / Plan / Recommendation History of present illness Pt is a 79 yo female with past history of diabetes mellitus, peripheral vascular disease and Alzheimer's dementia admitted with right great toe infection progressing up into foot. S/P RtAKA   Clinical Impression   This pt from an ADL standpoint is at baseline. Family is very knowledgeable of how to care for her at a bed level. No further OT needs, will sign off.    OT Assessment  Patient does not need any further OT services    Follow Up Recommendations  No OT follow up       Equipment Recommendations   Michiel Sites lift with AKA pad)          Precautions / Restrictions Precautions Precautions: Fall Restrictions Weight Bearing Restrictions: Yes RLE Weight Bearing: Non weight bearing   Pertinent Vitals/Pain PAINAD=5; repositioned and RN gave IV pain meds    ADL  ADL Comments: Pt was and is total A for all BADLs       Visit Information  Last OT Received On: 10/24/12 Assistance Needed: +2 History of Present Illness: Pt is a 79 yo female with past history of diabetes mellitus, peripheral vascular disease and Alzheimer's dementia admitted with right great toe infection progressing up into foot. S/P RtAKA       Prior Functioning     Home Living Family/patient expects to be discharged to:: Private residence Living Arrangements: Children;Other relatives Available Help at Discharge: Family;Available 24 hours/day Type of Home: House Home Access: Ramped entrance Home Equipment: Wheelchair - manual;Hospital bed Prior Function Level of Independence: Needs assistance ADL's / Homemaking Assistance Needed: Total A with BADLs Communication Communication:  (minimal communication pta)         Vision/Perception Vision - History Baseline  Vision:  (Blind in left eye) Patient Visual Report:  (Pt unable to give any report, decreased communication) Vision - Assessment Additional Comments: Pt with tendency to keep her head and eyes to the left. I asked pt's family if this is normal for her and they said no and then they said "something has caught her eye over there". I was able to get her to turn her eyes to the right momentarily with auditory (clapping) cues.   Cognition  Cognition Arousal/Alertness: Awake/alert Behavior During Therapy: Flat affect Overall Cognitive Status: History of cognitive impairments - at baseline    Extremity/Trunk Assessment Upper Extremity Assessment Upper Extremity Assessment: LUE deficits/detail;RUE deficits/detail RUE Deficits / Details: Edematous, difficult to assess due cognitive issues LUE Deficits / Details: More edematous than LUE, difficult to assess due cognitive issues     Mobility Bed Mobility Bed Mobility: Rolling Right;Rolling Left;Supine to Sit;Sit to Supine Rolling Right: 1: +2 Total assist Rolling Right: Patient Percentage: 0% Rolling Left: 1: +2 Total assist Rolling Left: Patient Percentage: 0% Supine to Sit: 1: +2 Total assist;HOB elevated Supine to Sit: Patient Percentage: 0% Sit to Supine: 1: +2 Total assist;HOB flat Sit to Supine: Patient Percentage: 0%        Balance Balance Balance Assessed: Yes Static Sitting Balance Static Sitting - Balance Support: Bilateral upper extremity supported;Feet supported Static Sitting - Level of Assistance: 2: Max assist Static Sitting - Comment/# of Minutes: 5 minutes   End of Session OT - End of Session Equipment Utilized During Treatment: Oxygen Activity Tolerance: Patient limited by fatigue Patient left: in bed;with  family/visitor present Nurse Communication:  (Assisted RN in changing RLE bandage)       Evette Georges [phone] 10/24/2012, 11:39 AM

## 2012-10-24 NOTE — Evaluation (Signed)
Physical Therapy Evaluation Patient Details Name: Emily Gibbs MRN: [id number] DOB: Oct 23, 1931 Today's Date: 10/24/2012 Time: 4098-1191 PT Time Calculation (min): 40 min  PT Assessment / Plan / Recommendation History of Present Illness  Pt is a 79 yo female with past history of diabetes mellitus, peripheral vascular disease and Alzheimer's dementia admitted with right great toe infection progressing up into foot. 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  Pt would benefit from acute PT for family education of safe mobilization of pt at home using hoyer lift.  Family assisted a little today with bed mobility/linen.  Dressing and ace wrap changed with RN at bedside.  Family to bring in w/c due to family states concerns for L LE skin integrity due to placement of leg rest release so may need new w/c depending on condition of current one.    PT Assessment  Patient needs continued PT services    Follow Up Recommendations  No PT follow up    Does the patient have the potential to tolerate intense rehabilitation      Barriers to Discharge        Equipment Recommendations  Other (comment) (hoyer lift with AKA pad, possibly W/C )    Recommendations for Other Services     Frequency Min 2X/week    Precautions / Restrictions Precautions Precautions: Fall Restrictions Weight Bearing Restrictions: Yes RLE Weight Bearing: Non weight bearing   Pertinent Vitals/Pain Grimacing with bandage change with RN in room, RN provided pain meds, R LE elevated      Mobility  Bed Mobility Bed Mobility: Rolling Right;Rolling Left;Supine to Sit;Sit to Supine Rolling Right: 1: +2 Total assist Rolling Right: Patient Percentage: 0% Rolling Left: 1: +2 Total assist Rolling Left: Patient Percentage: 0% Supine to Sit: 1: +2 Total assist;HOB elevated Supine to Sit: Patient Percentage: 0% Sit to Supine: 1: +2  Total assist;HOB flat Sit to Supine: Patient Percentage: 0% Details for Bed Mobility Assistance: tactile and manual cues however pt unable to assist likely due to cognition, family present and assist with changing bed pads Transfers Transfers: Not assessed    Exercises     PT Diagnosis: Generalized weakness;Acute pain  PT Problem List: Decreased mobility;Decreased cognition PT Treatment Interventions: DME instruction;Functional mobility training;Patient/family education     PT Goals(Current goals can be found in the care plan section) Acute Rehab PT Goals Patient Stated Goal: pt unable to state PT Goal Formulation: With family Time For Goal Achievement: 10/31/12 Potential to Achieve Goals: Good  Visit Information  Last PT Received On: 10/24/12 Assistance Needed: +2 History of Present Illness: Pt is a 79 yo female with past history of diabetes mellitus, peripheral vascular disease and Alzheimer's dementia admitted with right great toe infection progressing up into foot. 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       Prior Functioning  Home Living Family/patient expects to be discharged to:: Private residence Living Arrangements: Children;Other relatives Available Help at Discharge: Family;Available 24 hours/day Type of Home: House Home Access: Ramped entrance Home Equipment: Wheelchair - manual;Hospital bed Prior Function Level of Independence: Needs assistance Gait / Transfers Assistance Needed: granddaughter reports pt was able to ambulate with supervision approx 2 months ago however was requiring more assist leading up to hospitalization ADL's / Homemaking Assistance Needed: Total A with BADLs Communication Communication:  (minimal communication pta)    Cognition  Cognition Arousal/Alertness: Awake/alert Behavior During Therapy: Flat  affect Overall Cognitive Status: History of cognitive impairments - at baseline    Extremity/Trunk Assessment Upper Extremity Assessment Upper Extremity  Assessment: LUE deficits/detail;RUE deficits/detail RUE Deficits / Details: Edematous, difficult to assess due cognitive issues LUE Deficits / Details: More edematous than LUE, difficult to assess due cognitive issues Lower Extremity Assessment Lower Extremity Assessment: RLE deficits/detail;LLE deficits/detail;Difficult to assess due to impaired cognition RLE Deficits / Details: pt did not assist with bed mobility    Balance Balance Balance Assessed: Yes Static Sitting Balance Static Sitting - Balance Support: Bilateral upper extremity supported;Feet supported Static Sitting - Level of Assistance: 2: Max assist Static Sitting - Comment/# of Minutes: 5 min EOB, fatigues quickly, able to assist better with hand hold assist manual cues through UEs  End of Session PT - End of Session Activity Tolerance: Patient limited by pain;Patient limited by fatigue Patient left: in bed;with call bell/phone within reach;with nursing/sitter in room  GP     [facility] 10/24/2012, 1:04 PM Zenovia Jarred, PT, DPT 10/24/2012 Pager: [phone]

## 2012-10-24 NOTE — Progress Notes (Signed)
PATIENT DETAILS Name: Emily Gibbs Age: 79 y.o. Sex: female Date of Birth: May 27, 1931 Admit Date: 10/19/2012 Admitting Physician Hollice Espy, MD PCP:Pcp Not In System  Subjective: Patient non verbal this morning.  Nursing notes mention bleeding from the operative site overnight.  Assessment/Plan: Cellulitis/Gangrene of Right Great Toe  -Dry gangrene of distal tip of right great toe with cellulitis now s/p AKA or RLE -Antibiotics discontinued. -Had some bleeding and serosanguinous drainage overnight.  This has been evaluated by Dr. Arbie Cookey and has now stopped with ACE wrapping.  Worsening Anemia -2/2 to peri-operative loss -transfusing 2 units PRBC today -recheck CBC in am  Underlying PVD  -per daughter at bedside-has stents in the bilateral lower ext  -c/w Anti-platelet agents and Statins  DM -CBG's with moderate control -c/w SSI, but will increase to resistant  Dementia  -Patient will make eye contact but is not verbally responsive today (7/8) -history of chronic Alzheimer's dementia with a decline in responsiveness over the last few weeks  -continue memantine 10 mg po BID, and donepezil 10 mg po QHS  GERD  -seems to be stable  -continue to manage with home meds - pantoprazole 80 mg po daily   Deconditioning  - Per Daughter, patient has been bedbound for close to 2 months. Prior to that she was ambulating with some assistance.  -Being evaluated by CIR  Disposition: Remain inpatient.  Likely d/c 7/9.  Family at bedside wants to take her home.  DVT Prophylaxis: lovenox  Code Status:  DNR  Family Communication Daughter at bedside  Procedures:  ABI 7/4  CONSULTS:  vascular surgery   MEDICATIONS: Scheduled Meds: . aspirin  81 mg Oral Daily  . cilostazol  100 mg Oral BID  . citalopram  15 mg Oral QHS  . clopidogrel  75 mg Oral q morning - 10a  . divalproex  500 mg Oral QHS  . docusate sodium  100 mg Oral Daily  . donepezil  10 mg Oral QHS  .  furosemide  20 mg Oral Daily  . heparin subcutaneous  5,000 Units Subcutaneous Q8H  . insulin aspart  0-20 Units Subcutaneous TID WC  . insulin aspart  0-5 Units Subcutaneous QHS  . insulin glargine  10 Units Subcutaneous QHS  . memantine  10 mg Oral BID  . pantoprazole  80 mg Oral Q1200  . simvastatin  40 mg Oral QPM  . sodium chloride  3 mL Intravenous Q12H  . traZODone  150 mg Oral QHS   Continuous Infusions:  PRN Meds:.sodium chloride, acetaminophen, acetaminophen, HYDROmorphone (DILAUDID) injection, ondansetron (ZOFRAN) IV, ondansetron, oxyCODONE, sodium chloride  Antibiotics: Anti-infectives   Start     Dose/Rate Route Frequency Ordered Stop   10/20/12 1400  piperacillin-tazobactam (ZOSYN) IVPB 3.375 g  Status:  Discontinued     3.375 g 12.5 mL/hr over 240 Minutes Intravenous 3 times per day 10/20/12 1204 10/24/12 0721   10/20/12 1230  vancomycin (VANCOCIN) IVPB 1000 mg/200 mL premix  Status:  Discontinued     1,000 mg 200 mL/hr over 60 Minutes Intravenous Every 24 hours 10/20/12 1204 10/24/12 0721   10/19/12 1415  piperacillin-tazobactam (ZOSYN) IVPB 3.375 g     3.375 g 12.5 mL/hr over 240 Minutes Intravenous  Once 10/19/12 1407 10/19/12 1635   10/19/12 1215  clindamycin (CLEOCIN) capsule 300 mg     300 mg Oral  Once 10/19/12 1202 10/19/12 1340       PHYSICAL EXAM: Vital signs in last 24 hours: Filed Vitals:  10/23/12 1215 10/23/12 1233 10/23/12 2156 10/24/12 0507  BP: 122/67 122/73 149/79 163/67  Pulse: 80 85 92 91  Temp: 98.3 F (36.8 C) 98.1 F (36.7 C) 97.7 F (36.5 C) 99.5 F (37.5 C)  TempSrc:  Axillary Oral Oral  Resp: 15 16 17 17   Height:      Weight:      SpO2: 100% 96% 99% 96%    Weight change:  Filed Weights   10/19/12 1841  Weight: 67.903 kg (149 lb 11.2 oz)   Body mass index is 28.3 kg/(m^2).   Gen Exam: Awake but does not respond verbally.   Neck: Supple, No JVD.   Chest: B/L Clear.   CVS: S1 S2 Regular, 3/6 murmur of AS  Abdomen:  soft, BS +, non tender, non distended.  Extremities: RLE s/p AKA wrapped in Ace with evidence of drainage on the chuck pad.  Left lower extremity with pretibial abrasions from recent fall Neurologic: Non Focal.     Intake/Output from previous day:  Intake/Output Summary (Last 24 hours) at 10/24/12 0843 Last data filed at 10/24/12 4098  Gross per 24 hour  Intake   3585 ml  Output    250 ml  Net   3335 ml     LAB RESULTS: CBC  Recent Labs Lab 10/19/12 1243 10/19/12 1950 10/20/12 0520 10/21/12 0448 10/23/12 0445 10/24/12 0516  WBC 11.0* 11.5* 8.4 9.3 9.0 11.5*  HGB 10.9* 11.7* 10.6* 10.5* 9.7* 7.8*  HCT 35.4* 37.8 33.6* 34.0* 31.9* 26.1*  PLT 222 196 149* 187 175 180  MCV 73.6* 73.0* 72.6* 72.3* 73.0* 74.1*  MCH 22.7* 22.6* 22.9* 22.3* 22.2* 22.2*  MCHC 30.8 31.0 31.5 30.9 30.4 29.9*  RDW 15.7* 15.8* 15.7* 15.6* 15.5 15.8*  LYMPHSABS 2.8  --   --   --   --   --   MONOABS 1.2*  --   --   --   --   --   EOSABS 0.2  --   --   --   --   --   BASOSABS 0.0  --   --   --   --   --     Chemistries   Recent Labs Lab 10/19/12 1243 10/19/12 1950 10/20/12 0520 10/21/12 0448 10/23/12 0445 10/24/12 0516  NA 140  --  138 138 137 138  K 2.9* 4.0 4.3 3.6 3.7 3.5  CL 100  --  102 100 98 99  CO2 33*  --  28 32 32 35*  GLUCOSE 265*  --  177* 215* 262* 231*  BUN 8  --  7 5* 6 6  CREATININE 0.51 0.48* 0.38* 0.45* 0.49* 0.37*  CALCIUM 8.2*  --  8.2* 8.4 8.1* 8.0*    CBG:  Recent Labs Lab 10/23/12 1124 10/23/12 1232 10/23/12 1722 10/23/12 2151 10/24/12 0744  GLUCAP 153* 191* 293* 288* 241*     MICROBIOLOGY: Recent Results (from the past 240 hour(s))  SURGICAL PCR SCREEN     Status: None   Collection Time    10/22/12  2:32 PM      Result Value Range Status   MRSA, PCR NEGATIVE  NEGATIVE Final   Staphylococcus aureus NEGATIVE  NEGATIVE Final   Comment:            The Xpert SA Assay (FDA     approved for NASAL specimens     in patients over 41 years of age),      is one component of  a comprehensive surveillance     program.  Test performance has     been validated by [facility] for patients greater     than or equal to 28 year old.     It is not intended     to diagnose infection nor to     guide or monitor treatment.    RADIOLOGY STUDIES/RESULTS: Ct Head Wo Contrast  10/13/2012   *RADIOLOGY REPORT*  Clinical Data: Altered mental status.  Slurred speech.  Remote history breast cancer.  CT HEAD WITHOUT CONTRAST  Technique:  Contiguous axial images were obtained from the base of the skull through the vertex without contrast.  Comparison: None.  Findings: Remote appearing right occipital lobe infarct noted. Faint calcification along the left globus pallidus nucleus is likely physiologic.  There is chronic microvascular white matter disease.  There is more confluent white matter hypodensity in the left parietal lobe extending to the attenuated left parietal cortex, potentially from chronic or subacute stroke small chronic-appearing left periventricular white matter lacunar infarct noted.  No intracranial hemorrhage or discrete mass lesion is identified. Homogeneous abnormal high density in left globe, query prior injected silicone oil.  There is mild frothy material in the left sphenoid sinus.  IMPRESSION:  1.  Subacute or chronic left high parietal infarct.  Remote right occipital lobe infarct. 2. Periventricular and corona radiata white matter hypodensities are most compatible with chronic ischemic microvascular white matter disease. 3.  Very homogeneous abnormal high density in left globe.  Although hemorrhage and other conditions can cause this appearance, correlate with any history of injected silicone oil in the globe. 4.  Chronic left sphenoid sinusitis.   Original Report Authenticated By: Gaylyn Rong, M.D.   Mr Foot Left W Wo Contrast  10/20/2012   *RADIOLOGY REPORT*  Clinical Data: Cellulitis and gangrenous left toe.  Diabetes. Query  osteomyelitis.  MRI OF THE LEFT FOREFOOT WITHOUT AND WITH CONTRAST  Technique:  Multiplanar, multisequence MR imaging was performed both before and after administration of intravenous contrast.  Contrast: 15mL MULTIHANCE GADOBENATE DIMEGLUMINE 529 MG/ML IV SOLN  Comparison: 07/30/2011  Findings: Despite efforts by the patient and technologist, motion artifact is present on some series of today's examination and could not be totally eliminated.  This reduces diagnostic sensitivity and specificity.  There appears to be reduction of soft tissue in the great toe around the distal half of the distal phalanx concerning for tissue necrosis/ulceration on image 4 of series 10 and image 16 of series 9 there appears to be increased signal in the distal phalanx on inversion recovery weighted images.  However, no overt enhancement is identified in the bone.  No drainable abscess observed. Plantar muscular atrophy noted with low-level edema tracking along the plantar fascia.  IMPRESSION:  1.  Abnormal increased inversion recovery weighted signal in the distal phalanx of the great toe with surrounding soft tissue defect, but without abnormal enhancement in the phalanx. Admittedly today's exam is limited by the prominent degree of motion artifact.  Infarct or distal phalangeal devascularization as a cause for lack of enhancement is raised as a possibility, although it seems unlikely that the T1 precontrast fatty marrow would be maintained in this situation.  Accordingly, the results are atypical and suspicious for but not entirely diagnostic of osteomyelitis in the distal phalanx of the great toe. 2.  No drainable abscess observed.   Original Report Authenticated By: Gaylyn Rong, M.D.   Dg Chest Portable 1 View  10/13/2012   *  RADIOLOGY REPORT*  Clinical Data: Altered mental status.  Weakness.  History of diabetes and Alzheimer's disease.  PORTABLE CHEST - 1 VIEW  Comparison: Limited correlation is made with an abdominal CT  01/02/2011.  Findings: 1748 hours.  There are low lung volumes with left greater than right basilar air space opacities.  Heart size is normal. There is aortic atherosclerosis.  There may be a small amount of pleural fluid on the left.  Telemetry leads overlie the chest.  IMPRESSION: Patchy bibasilar air space opacities worrisome for aspiration pneumonia.   Original Report Authenticated By: Carey Bullocks, M.D.   Dg Foot Complete Right  10/19/2012   *RADIOLOGY REPORT*  Clinical Data: Wound infection  RIGHT FOOT COMPLETE - 3+ VIEW  Comparison: 09/17/2012  Findings: Three views of the right foot submitted.  Again noted diffuse osteopenia.  No acute fracture or subluxation.  Again note cortical irregularity at the tip of distal phalanx great toe. Osteomyelitis cannot be excluded.  Clinical correlation is necessary.  IMPRESSION: No acute fracture or subluxation.  Again note cortical irregularity at the tip of distal phalanx great toe.  Osteomyelitis cannot be excluded.  Clinical correlation is necessary.   Original Report Authenticated By: Natasha Mead, M.D.    [place] [facility] Pager:[phone]  If 7PM-7AM, please contact night-coverage [email or web address] Password [facility] 10/24/2012, 8:43 AM   LOS: 5 days   Attending Patient seen and examined, agree with the assessment and plan as outlined above. Peri-operative blood loss anemia-which will require 2 units of PRBC transfusion, rest of the medical stable. ?Home in am,family initially wanted SNF-but now they have decided to take her home  S Aaryana Betke

## 2012-10-24 NOTE — Progress Notes (Signed)
PT had a critical hgb of 7.8. rn paged np on call and is awaiting further orders

## 2012-10-24 NOTE — Progress Notes (Signed)
Patient ID: Emily Gibbs, female   DOB: 01/16/1932, 79 y.o.   MRN: [id number] Patient is resting comfortably. Her family members are spent the night with her reports that she slept through the night with no distress and no sign of discomfort.  She did have some serosanguineous oozing through her dressing and this was controlled with placing a second six-inch Ace with no further bleeding. Did receive 2 units packed cells for postop anemia.  Will check her dressing change tomorrow. Should be okay for discharge tomorrow assuming the incision is healing adequately.

## 2012-10-24 NOTE — Consult Note (Signed)
Physical Medicine and Rehabilitation Consult Reason for Consult: Right above-knee amputation Referring Physician: [facility]   HPI: Emily Gibbs is a 79 y.o. right-handed female with history of diabetes mellitus with peripheral neuropathy, Parkinson's disease as well as dementia. Noted patient with progressive decline over the last 2 months and had been essentially bedbound and prior to that was ambulating with some assistance. Patient lives with her daughter in [place]. Presented 10/22/2012 with nonhealing wound right foot. X-rays noted cortical irregularity at the tip of the distal phalanx great toe. No relief with conservative care and underwent right above-knee amputation 10/23/2012 per Dr. Arbie Cookey. Placed on subcutaneous Lovenox for DVT prophylaxis. Postoperative pain management. Acute blood loss anemia with hemoglobin 7.8 and monitored. Patient also with close monitoring of left toe ischemic changes with x-rays completed showing no current identifiable osteomyelitis. Physical and occupational therapy evaluations are pending. M.D. is requested physical medicine rehabilitation consult to consider inpatient rehabilitation services.  Undergoing transfusion  Review of Systems  Unable to perform ROS: dementia   Past Medical History  Diagnosis Date  . Diabetes mellitus without complication   . Arthritis   . Parkinson disease   . Alzheimer disease   . Dementia   . Aortic stenosis   . Heart murmur   . Cancer     Breast    Past Surgical History  Procedure Laterality Date  . Abdominal hysterectomy    . Mastectomy      Left  . Femoral artery stent     History reviewed. No pertinent family history. Social History:  reports that she has never smoked. Her smokeless tobacco use includes Snuff. She reports that she does not drink alcohol or use illicit drugs. Allergies: No Known Allergies Medications Prior to Admission  Medication Sig Dispense Refill  . aspirin 81 MG tablet Take 81 mg by  mouth daily.      . cilostazol (PLETAL) 100 MG tablet Take 100 mg by mouth 2 (two) times daily.      . citalopram (CELEXA) 10 MG tablet Take 15 mg by mouth at bedtime.       . clopidogrel (PLAVIX) 75 MG tablet Take 75 mg by mouth every morning.      . divalproex (DEPAKOTE) 500 MG DR tablet Take 500 mg by mouth at bedtime.      . donepezil (ARICEPT) 10 MG tablet Take 10 mg by mouth at bedtime.      Marland Kitchen esomeprazole (NEXIUM) 40 MG capsule Take 40 mg by mouth daily before breakfast.      . fish oil-omega-3 fatty acids 1000 MG capsule Take 2 g by mouth every morning.      . furosemide (LASIX) 20 MG tablet Take 20 mg by mouth daily.      . insulin lispro (HUMALOG) 100 UNIT/ML injection Inject 20 Units into the skin daily before breakfast.      . memantine (NAMENDA) 10 MG tablet Take 10 mg by mouth 2 (two) times daily.      . Naproxen Sodium (ALEVE) 220 MG CAPS Take 220 mg by mouth daily as needed (pain).      Marland Kitchen OVER THE COUNTER MEDICATION Take 0.5 tablets by mouth daily as needed (allergy). Over the counter store brand ( [facility]) allergy medication      . simvastatin (ZOCOR) 40 MG tablet Take 40 mg by mouth every evening.      . traZODone (DESYREL) 100 MG tablet Take 150 mg by mouth at bedtime.      Marland Kitchen  vitamin E 1000 UNIT capsule Take 1,000 Units by mouth daily.        Home: Home Living Family/patient expects to be discharged to:: Private residence Living Arrangements: Children  Functional History:   Functional Status:  Mobility:          ADL:    Cognition: Cognition Orientation Level: Other (comment) (will not vocalize at this time)    Blood pressure 163/67, pulse 91, temperature 99.5 F (37.5 C), temperature source Oral, resp. rate 17, height 5\' 1"  (1.549 m), weight 67.903 kg (149 lb 11.2 oz), SpO2 96.00%. Physical Exam  Vitals reviewed. HENT:  Head: Normocephalic.  Eyes:  Pupils reactive to light  Neck: Neck supple. No thyromegaly present.  Cardiovascular: Normal rate and  regular rhythm.   Pulmonary/Chest: Effort normal and breath sounds normal. No respiratory distress.  Abdominal: Soft. Bowel sounds are normal. She exhibits no distension.  Neurological:  Patient is lethargic and difficult to arouse. She was nonverbal during exam . Patient did make eye contact with examiner. She did not follow commands.  Skin:  Surgical site is dressed. Noted left great toe ischemic changes with no drainage   could not perform sensory exam and motor exam secondary to mental status  Results for orders placed during the hospital encounter of 10/19/12 (from the past 24 hour(s))  GLUCOSE, CAPILLARY     Status: Abnormal   Collection Time    10/23/12  7:27 AM      Result Value Range   Glucose-Capillary 199 (*) 70 - 99 mg/dL  GLUCOSE, CAPILLARY     Status: Abnormal   Collection Time    10/23/12 11:24 AM      Result Value Range   Glucose-Capillary 153 (*) 70 - 99 mg/dL   Comment 1 Documented in Chart     Comment 2 Notify RN    GLUCOSE, CAPILLARY     Status: Abnormal   Collection Time    10/23/12 12:32 PM      Result Value Range   Glucose-Capillary 191 (*) 70 - 99 mg/dL  GLUCOSE, CAPILLARY     Status: Abnormal   Collection Time    10/23/12  5:22 PM      Result Value Range   Glucose-Capillary 293 (*) 70 - 99 mg/dL  GLUCOSE, CAPILLARY     Status: Abnormal   Collection Time    10/23/12  9:51 PM      Result Value Range   Glucose-Capillary 288 (*) 70 - 99 mg/dL  CBC     Status: Abnormal   Collection Time    10/24/12  5:16 AM      Result Value Range   WBC 11.5 (*) 4.0 - 10.5 K/uL   RBC 3.52 (*) 3.87 - 5.11 MIL/uL   Hemoglobin 7.8 (*) 12.0 - 15.0 g/dL   HCT 16.1 (*) 09.6 - 04.5 %   MCV 74.1 (*) 78.0 - 100.0 fL   MCH 22.2 (*) 26.0 - 34.0 pg   MCHC 29.9 (*) 30.0 - 36.0 g/dL   RDW 40.9 (*) 81.1 - 91.4 %   Platelets 180  150 - 400 K/uL  BASIC METABOLIC PANEL     Status: Abnormal   Collection Time    10/24/12  5:16 AM      Result Value Range   Sodium 138  135 - 145  mEq/L   Potassium 3.5  3.5 - 5.1 mEq/L   Chloride 99  96 - 112 mEq/L   CO2 35 (*) 19 - 32  mEq/L   Glucose, Bld 231 (*) 70 - 99 mg/dL   BUN 6  6 - 23 mg/dL   Creatinine, Ser 1.61 (*) 0.50 - 1.10 mg/dL   Calcium 8.0 (*) 8.4 - 10.5 mg/dL   GFR calc non Af Amer >90  >90 mL/min   GFR calc Af Amer >90  >90 mL/min   No results found.  Assessment/Plan: Diagnosis: Right AKA superimposed on severe dementia 1. Does the need for close, 24 hr/day medical supervision in concert with the patient's rehab needs make it unreasonable for this patient to be served in a less intensive setting? No 2. Co-Morbidities requiring supervision/potential complications: Not applicable 3. Due to Not applicable, does the patient require 24 hr/day rehab nursing? No 4. Does the patient require coordinated care of a physician, rehab nurse, Not applicable to address physical and functional deficits in the context of the above medical diagnosis(es)? No Addressing deficits in the following areas: NA 5. Can the patient actively participate in an intensive therapy program of at least 3 hrs of therapy per day at least 5 days per week? No 6. The potential for patient to make measurable gains while on inpatient rehab is poor 7. Anticipated functional outcomes upon discharge from inpatient rehab are total assist with PT, total assist with OT, NA with SLP. 8. Estimated rehab length of stay to reach the above functional goals is: NA 9. Does the patient have adequate social supports to accommodate these discharge functional goals? Yes 10. Anticipated D/C setting: Home 11. Anticipated post D/C treatments: HH therapy 12. Overall Rehab/Functional Prognosis: poor  RECOMMENDATIONS: This patient's condition is appropriate for continued rehabilitative care in the following setting: HH Patient has agreed to participate in recommended program. Unable to agree Note that insurance prior authorization may be required for reimbursement for  recommended care.  Comment: Daughter feels like she can care for the patient's as long as her wound is healing well and her pain is under control    10/24/2012

## 2012-10-25 DIAGNOSIS — Z89619 Acquired absence of unspecified leg above knee: Secondary | ICD-10-CM

## 2012-10-25 DIAGNOSIS — D62 Acute posthemorrhagic anemia: Secondary | ICD-10-CM | POA: Diagnosis not present

## 2012-10-25 LAB — CBC
MCH: 23.7 pg — ABNORMAL LOW (ref 26.0–34.0)
MCHC: 31.7 g/dL (ref 30.0–36.0)
MCV: 74.6 fL — ABNORMAL LOW (ref 78.0–100.0)
Platelets: 168 10*3/uL (ref 150–400)
RDW: 16.6 % — ABNORMAL HIGH (ref 11.5–15.5)

## 2012-10-25 LAB — GLUCOSE, CAPILLARY
Glucose-Capillary: 273 mg/dL — ABNORMAL HIGH (ref 70–99)
Glucose-Capillary: 257 mg/dL — ABNORMAL HIGH (ref 70–99)
Glucose-Capillary: 218 mg/dL — ABNORMAL HIGH (ref 70–99)

## 2012-10-25 LAB — BASIC METABOLIC PANEL
CO2: 33 mEq/L — ABNORMAL HIGH (ref 19–32)
Calcium: 8 mg/dL — ABNORMAL LOW (ref 8.4–10.5)
Creatinine, Ser: 0.44 mg/dL — ABNORMAL LOW (ref 0.50–1.10)
GFR calc non Af Amer: >90 mL/min (ref >90)
Glucose, Bld: 282 mg/dL — ABNORMAL HIGH (ref 70–99)

## 2012-10-25 MED ORDER — INSULIN GLARGINE 100 UNIT/ML ~~LOC~~ SOLN
20.0000 [IU] | Freq: Every day | SUBCUTANEOUS | Status: DC
  Administered 2012-10-25: 20 [IU] via SUBCUTANEOUS
  Filled 2012-10-25: qty 0.2

## 2012-10-25 MED ORDER — FLEET ENEMA 7-19 GM/118ML RE ENEM
1.0000 | ENEMA | Freq: Once | RECTAL | Status: AC
  Administered 2012-10-25: 1 via RECTAL
  Filled 2012-10-25: qty 1

## 2012-10-25 MED ORDER — INSULIN ASPART 100 UNIT/ML ~~LOC~~ SOLN
0.0000 [IU] | Freq: Three times a day (TID) | SUBCUTANEOUS | Status: DC
  Administered 2012-10-25: 4 [IU] via SUBCUTANEOUS

## 2012-10-25 MED ORDER — INSULIN ASPART 100 UNIT/ML ~~LOC~~ SOLN
3.0000 [IU] | Freq: Three times a day (TID) | SUBCUTANEOUS | Status: DC
  Administered 2012-10-25 (×2): 3 [IU] via SUBCUTANEOUS

## 2012-10-25 MED ORDER — INSULIN LISPRO 100 UNIT/ML ~~LOC~~ SOLN: 10.0000 [IU] | Freq: Three times a day (TID) | SUBCUTANEOUS | Status: DC

## 2012-10-25 NOTE — Discharge Summary (Signed)
Physician Discharge Summary  Emily Gibbs RUE:[id number] DOB: 11-08-1931 DOA: 10/19/2012  PCP: Pcp Not In System  Admit date: 10/19/2012 Discharge date: 10/27/2012  Time spent:  50 minutes. Recommendations for Outpatient Follow-up:   D/C to SNF vs Home with Home Health RN for wound care and assistance with DM management, Home Health PT, Aide, and Social Worker  Follow up with Dr. Arbie Cookey in 4 weeks - Approx 8/4.  Hospital follow up with new PCP in 1-2 weeks for blood work (cbc, bmet) and on-going management of anemia, DM, Aortic stenosis, Arthritis and Alzheimer disease.  Discharge Diagnoses:  Principal Problem:   Cellulitis of great toe of right foot Active Problems:   DM (diabetes mellitus), type 2, uncontrolled, periph vascular complic   Dementia   GERD (gastroesophageal reflux disease)   Dry gangrene   PVD (peripheral vascular disease)   S/P AKA (above knee amputation)   Postoperative anemia due to acute blood loss   Discharge Condition: stable, mental status pleasantly demented. Diet recommendation: dysphagia 2, chopped, Heart healthy.  Filed Weights   10/19/12 1841  Weight: 67.903 kg (149 lb 11.2 oz)    History of present illness:  Emily Gibbs is a 79 y.o. female  With past history of diabetes mellitus, peripheral vascular disease and Alzheimer's dementia who has been residing with her daughter and occasionally takes trips down to [place] to visit her other daughter and see her physicians down there. Prior to June 1, the patient had a small wound on the distal aspect of her left great toe which as advised by wound care was small and they recommended watching and treating to keep it dry. Wound is felt to be dry gangrene and would auto amputate. Patient spent approximately 2-3 weeks then with her other daughter in [place]. According to the daughter here, the patient at that time spent most of it in bed. When the daughter [place] saw her mother  approximately 10-14 days ago, she noted that the dry gangrenous area had become swollen and blackened extending further down the toe. She took her mother to a podiatrist here in [place] who debrided the lesion. Following debridement, the wound has since had some moderate amount of drainage and has also started having secondary discoloration extending past the toe into the foot. Family was concerned and was advised to bring patient emergency room today. The emergency room, patient was noted have mild white count of 11 elevated blood sugars around 300 and an x-ray which could not rule out osteomyelitis. Hospitalists were called for further evaluation and admission.  Hospital Course:  Cellulitis/Gangrene of Right Great Toe  -Dry gangrene of distal tip of right great toe with cellulitis now s/p AKA of RLE on 7/7. -Antibiotics were discontinued after surgery. -Mrs. Bisch had some bleeding and serosanguinous drainage post op causing acute blood loss anemia.  Chronic anemia with Acute Blood Loss Anemia post op. -Secondary to peri-operative loss  -transfused 2 units PRBC on 7/8, transfused 2 additional units on 7/10.  Hbg 10 at the time of discharge. -Patient on 3 antiplatelets (aspirin, plavix and pletal) at the time of admission. Discontinued all antiplatelets with the exception of aspirin after discussing antiplatelet therapy with vascular surgery (Dr. Arbie Cookey).  The patient needs to be allowed to stop bleeding.  Consider re-assessment for additional antiplatelet therapy after discharge.    Underlying PVD  -per daughter at bedside-has femoral artery stents in the bilateral lower ext  -continue with statin. -Patient on 3 antiplatelets (aspirin, plavix  and pletal) at the time of admission. Discontinued all antiplatelets with the exception of aspirin after discussing antiplatelet therapy with vascular surgery (Dr. Arbie Cookey).  The patient needs to be allowed to stop bleeding.  Consider re-assessment for  additional antiplatelet therapy after discharge.    DM  -CBG's with moderate control  -She was treated with SSI resistant, meal coverage and lantus while inpatient. -Will resume humalog 10 units TID once discharged. -Appreciate DM Coordinators working with family to answer their questions and make recommendations.   Dementia  -Patient will make eye contact but is minimally verbally responsive  -history of chronic Alzheimer's dementia with a decline in responsiveness over the last few weeks.  Family reports she is at baseline. -continue memantine 10 mg po BID, and donepezil 10 mg po QHS   GERD  -seems to be stable  -continue to manage with home meds - pantoprazole 80 mg po daily   Deconditioning  - Per Daughter, patient has been bedbound for close to 2+  months. Prior to that she was ambulating with some assistance.  - Will go home with home health PT   Procedures: Right AKA on 7/7  Consultations: Orthopedic Surgery  Discharge Exam: Filed Vitals:   10/26/12 1545 10/26/12 1640 10/26/12 2127 10/27/12 0553  BP: 123/76 124/79 147/71 116/57  Pulse: 99 102 94 100  Temp: 98.8 F (37.1 C) 99.6 F (37.6 C) 99.3 F (37.4 C) 99.5 F (37.5 C)  TempSrc: Axillary Axillary Axillary Oral  Resp: 18 18 18 18   Height:      Weight:      SpO2:   97% 96%    Gen Exam: Awake but does not respond verbally. Appears comfortable Neck: Supple, No JVD.  Chest: B/L Clear. No w/c/r  CVS: S1 S2 Regular, 3/6 murmur of AS  Abdomen: soft, BS +, non tender, non distended.  Extremities: RLE s/p AKA wrapped in gauze (clean and dry). Left lower extremity with pretibial abrasions from recent fall.  Hands with less edema today (7/11) Neurologic: Non Focal.     Discharge Instructions      Discharge Orders   Future Appointments Provider Department Dept Phone   11/21/2012 9:00 AM Larina Earthly, MD [facility] [phone]   Future Orders Complete By Expires     Diet - low  sodium heart healthy  As directed     Comments:      Dysphagia 2 chopped diet.    Increase activity slowly  As directed         Medication List    STOP taking these medications       ALEVE 220 MG Caps  Generic drug:  Naproxen Sodium     cilostazol 100 MG tablet  Commonly known as:  PLETAL     clopidogrel 75 MG tablet  Commonly known as:  PLAVIX     insulin lispro protamine-lispro (75-25) 100 UNIT/ML Susp  Commonly known as:  HUMALOG 75/25     OVER THE COUNTER MEDICATION      TAKE these medications       acetaminophen 325 MG tablet  Commonly known as:  TYLENOL  Take 2 tablets (650 mg total) by mouth every 6 (six) hours as needed.     aspirin 81 MG tablet  Take 81 mg by mouth daily.     citalopram 10 MG tablet  Commonly known as:  CELEXA  Take 15 mg by mouth at bedtime.     clotrimazole 1 % vaginal  cream  Commonly known as:  GYNE-LOTRIMIN  Place 1 Applicatorful vaginally daily.     divalproex 500 MG DR tablet  Commonly known as:  DEPAKOTE  Take 500 mg by mouth at bedtime.     donepezil 10 MG tablet  Commonly known as:  ARICEPT  Take 10 mg by mouth at bedtime.     esomeprazole 40 MG capsule  Commonly known as:  NEXIUM  Take 40 mg by mouth daily before breakfast.     fish oil-omega-3 fatty acids 1000 MG capsule  Take 2 g by mouth every morning.     furosemide 20 MG tablet  Commonly known as:  LASIX  Take 20 mg by mouth daily.     insulin lispro 100 UNIT/ML injection  Commonly known as:  HUMALOG  Inject 10 Units into the skin 3 (three) times daily before meals.     memantine 10 MG tablet  Commonly known as:  NAMENDA  Take 10 mg by mouth 2 (two) times daily.     oxyCODONE 5 MG immediate release tablet  Commonly known as:  Oxy IR/ROXICODONE  Take 1-2 tablets (5-10 mg total) by mouth every 6 (six) hours as needed.     simvastatin 40 MG tablet  Commonly known as:  ZOCOR  Take 40 mg by mouth every evening.     traZODone 100 MG tablet  Commonly  known as:  DESYREL  Take 150 mg by mouth at bedtime.     vitamin E 1000 UNIT capsule  Take 1,000 Units by mouth daily.       No Known Allergies Follow-up Information   Follow up with EARLY, TODD, MD In 4 weeks. (office will arrange-sent)    Contact information:   [address] [place] [address] [phone]       Follow up with [facility] [place] [facility] RN, physical therapy, occupational therapy, Social Worker, and aide)    Contact information:   [phone]       The results of significant diagnostics from this hospitalization (including imaging, microbiology, ancillary and laboratory) are listed below for reference.    Significant Diagnostic Studies: Ct Head Wo Contrast  10/13/2012   *RADIOLOGY REPORT*  Clinical Data: Altered mental status.  Slurred speech.  Remote history breast cancer.  CT HEAD WITHOUT CONTRAST  Technique:  Contiguous axial images were obtained from the base of the skull through the vertex without contrast.  Comparison: None.  Findings: Remote appearing right occipital lobe infarct noted. Faint calcification along the left globus pallidus nucleus is likely physiologic.  There is chronic microvascular white matter disease.  There is more confluent white matter hypodensity in the left parietal lobe extending to the attenuated left parietal cortex, potentially from chronic or subacute stroke small chronic-appearing left periventricular white matter lacunar infarct noted.  No intracranial hemorrhage or discrete mass lesion is identified. Homogeneous abnormal high density in left globe, query prior injected silicone oil.  There is mild frothy material in the left sphenoid sinus.  IMPRESSION:  1.  Subacute or chronic left high parietal infarct.  Remote right occipital lobe infarct. 2. Periventricular and corona radiata white matter hypodensities are most compatible with chronic ischemic microvascular white matter disease. 3.  Very homogeneous abnormal high  density in left globe.  Although hemorrhage and other conditions can cause this appearance, correlate with any history of injected silicone oil in the globe. 4.  Chronic left sphenoid sinusitis.   Original Report Authenticated By: Gaylyn Rong, M.D.   Mr  Foot Left W Wo Contrast  10/20/2012   *RADIOLOGY REPORT*  Clinical Data: Cellulitis and gangrenous left toe.  Diabetes. Query osteomyelitis.  MRI OF THE LEFT FOREFOOT WITHOUT AND WITH CONTRAST  Technique:  Multiplanar, multisequence MR imaging was performed both before and after administration of intravenous contrast.  Contrast: 15mL MULTIHANCE GADOBENATE DIMEGLUMINE 529 MG/ML IV SOLN  Comparison: 07/30/2011  Findings: Despite efforts by the patient and technologist, motion artifact is present on some series of today's examination and could not be totally eliminated.  This reduces diagnostic sensitivity and specificity.  There appears to be reduction of soft tissue in the great toe around the distal half of the distal phalanx concerning for tissue necrosis/ulceration on image 4 of series 10 and image 16 of series 9 there appears to be increased signal in the distal phalanx on inversion recovery weighted images.  However, no overt enhancement is identified in the bone.  No drainable abscess observed. Plantar muscular atrophy noted with low-level edema tracking along the plantar fascia.  IMPRESSION:  1.  Abnormal increased inversion recovery weighted signal in the distal phalanx of the great toe with surrounding soft tissue defect, but without abnormal enhancement in the phalanx. Admittedly today's exam is limited by the prominent degree of motion artifact.  Infarct or distal phalangeal devascularization as a cause for lack of enhancement is raised as a possibility, although it seems unlikely that the T1 precontrast fatty marrow would be maintained in this situation.  Accordingly, the results are atypical and suspicious for but not entirely diagnostic of  osteomyelitis in the distal phalanx of the great toe. 2.  No drainable abscess observed.   Original Report Authenticated By: Gaylyn Rong, M.D.   Dg Chest Portable 1 View  10/13/2012   *RADIOLOGY REPORT*  Clinical Data: Altered mental status.  Weakness.  History of diabetes and Alzheimer's disease.  PORTABLE CHEST - 1 VIEW  Comparison: Limited correlation is made with an abdominal CT 01/02/2011.  Findings: 1748 hours.  There are low lung volumes with left greater than right basilar air space opacities.  Heart size is normal. There is aortic atherosclerosis.  There may be a small amount of pleural fluid on the left.  Telemetry leads overlie the chest.  IMPRESSION: Patchy bibasilar air space opacities worrisome for aspiration pneumonia.   Original Report Authenticated By: Carey Bullocks, M.D.   Dg Foot Complete Right  10/19/2012   *RADIOLOGY REPORT*  Clinical Data: Wound infection  RIGHT FOOT COMPLETE - 3+ VIEW  Comparison: 09/17/2012  Findings: Three views of the right foot submitted.  Again noted diffuse osteopenia.  No acute fracture or subluxation.  Again note cortical irregularity at the tip of distal phalanx great toe. Osteomyelitis cannot be excluded.  Clinical correlation is necessary.  IMPRESSION: No acute fracture or subluxation.  Again note cortical irregularity at the tip of distal phalanx great toe.  Osteomyelitis cannot be excluded.  Clinical correlation is necessary.   Original Report Authenticated By: Natasha Mead, M.D.    Microbiology: Recent Results (from the past 240 hour(s))  SURGICAL PCR SCREEN     Status: None   Collection Time    10/22/12  2:32 PM      Result Value Range Status   MRSA, PCR NEGATIVE  NEGATIVE Final   Staphylococcus aureus NEGATIVE  NEGATIVE Final   Comment:            The Xpert SA Assay (FDA     approved for NASAL specimens     in patients  over 40 years of age),     is one component of     a comprehensive surveillance     program.  Test performance has      been validated by [employer] for patients greater     than or equal to 34 year old.     It is not intended     to diagnose infection nor to     guide or monitor treatment.     Labs: Basic Metabolic Panel:  Recent Labs Lab 10/21/12 0448 10/23/12 0445 10/24/12 0516 10/25/12 0545 10/26/12 0545  NA 138 137 138 137  --   K 3.6 3.7 3.5 3.8  --   CL 100 98 99 98  --   CO2 32 32 35* 33*  --   GLUCOSE 215* 262* 231* 282*  --   BUN 5* 6 6 8   --   CREATININE 0.45* 0.49* 0.37* 0.44* 0.45*  CALCIUM 8.4 8.1* 8.0* 8.0*  --    CBC:  Recent Labs Lab 10/24/12 0516 10/24/12 1900 10/25/12 0545 10/26/12 0545 10/27/12 0904  WBC 11.5* 14.0* 13.3* 10.4 11.9*  HGB 7.8* 10.0* 9.2* 7.4* 10.7*  HCT 26.1* 31.6* 29.0* 23.5* 33.1*  MCV 74.1* 74.7* 74.6* 75.1* 79.2  PLT 180 167 168 166 166   CBG:  Recent Labs Lab 10/26/12 0751 10/26/12 1159 10/26/12 1658 10/26/12 2123 10/27/12 0753  GLUCAP 255* 162* 221* 213* 222*       Signed:  Stephani Police, PA-C  [facility] 10/27/2012, 11:01 AM   Addendum  Patient seen and examined, chart and data base reviewed.  I agree with the above assessment and discharge plan.  For full details please see Mrs. Algis Downs PA note.  Status post right BKA, complicated with postoperative blood loss anemia.  Patient had total of 4 units of packed RBCs transfused.   Clint Lipps, MD [facility] Pager: [phone] 10/27/2012, 11:49 AM

## 2012-10-25 NOTE — Progress Notes (Signed)
Addendum  Patient seen and examined, chart and data base reviewed.  I agree with the above assessment and plan.  For full details please see Mrs. Algis Downs PA note.  S/P right AKA. Some bleeding from the stump, vascular to re-evaluate later today.   Clint Lipps, MD [facility] Pager: [phone] 10/25/2012, 12:55 PM

## 2012-10-25 NOTE — Progress Notes (Addendum)
[facility]   Postoperative Visit - Amputation  Date of Surgery: 10/19/2012 - 10/23/2012 Procedure(s): AMPUTATION ABOVE KNEE Right Surgeon: Surgeon(s): Larina Earthly, MD  2 Days Post-Op  Subjective Marcellene Shivley is a 79 y.o. female who is 2 Days Post-Op. Pt. C/O pain in the stump with dressing change otherwise  appears comfortable.   Significant Diagnostic Studies: CBC Lab Results  Component Value Date   WBC 13.3* 10/25/2012   HGB 9.2* 10/25/2012   HCT 29.0* 10/25/2012   MCV 74.6* 10/25/2012   PLT 168 10/25/2012    BMET    Component Value Date/Time   NA 137 10/25/2012 0545   K 3.8 10/25/2012 0545   CL 98 10/25/2012 0545   CO2 33* 10/25/2012 0545   GLUCOSE 282* 10/25/2012 0545   BUN 8 10/25/2012 0545   CREATININE 0.44* 10/25/2012 0545   CALCIUM 8.0* 10/25/2012 0545   GFRNONAA >90 10/25/2012 0545   GFRAA >90 10/25/2012 0545    COAG Lab Results  Component Value Date   INR 1.00 10/13/2012   No results found for this basename: PTT     Intake/Output Summary (Last 24 hours) at 10/25/12 0857 Last data filed at 10/24/12 1750  Gross per 24 hour  Intake 638.83 ml  Output      0 ml  Net 638.83 ml    Physical Examination  BP Readings from Last 3 Encounters:  10/25/12 110/49  10/25/12 110/49  10/13/12 118/62   Temp Readings from Last 3 Encounters:  10/25/12 99.9 F (37.7 C) Axillary  10/25/12 99.9 F (37.7 C) Axillary  10/13/12 98.5 F (36.9 C) Oral   SpO2 Readings from Last 3 Encounters:  10/25/12 94%  10/25/12 94%  10/13/12 98%   Pulse Readings from Last 3 Encounters:  10/25/12 114  10/25/12 114  10/13/12 82    Pt is A&Ox3  WDWN female with no complaints  Right amputation wound is healing well.  There is good bone coverage in the stump Stump is warm and well perfused, without drainage; without erythema Skin bleeder noted, large clot on dressing New pressure dressing applied  Assessment/plan: Kaytie Ratcliffe is a 79 y.o. female who is s/p  Right  AMPUTATION ABOVE KNEE Skin bleeder - will reassess later today- if still bleeding will place stitch or staple in the area PT for transfers  The patient's stump is viable.  Follow-up 4 weeks from surgery  ROCZNIAK,REGINA J 8:57 AM 10/25/2012   The dressing was checked at 3:30 pm and there was bloody saturation.  The wound was closed using staples and the bleeding stopped after 5 min. Of observation.  Clean dry dressing was applied.  The patient tolerated this well. Nikholas Geffre MAUREEN PA-C

## 2012-10-25 NOTE — Clinical Social Work Psychosocial (Signed)
Clinical Social Work Department BRIEF PSYCHOSOCIAL ASSESSMENT 10/25/2012  Patient:  Emily Gibbs,Emily Gibbs     Account Number:  [id number]     Admit date:  10/19/2012  Clinical Social Worker:  Delmer Islam  Date/Time:  10/25/2012 05:34 AM  Referred by:  Physician  Date Referred:  10/24/2012 Referred for  SNF Placement   Other Referral:   Interview type:  Family Other interview type:   Daughter Emily Gibbs [phone]) and granddaughter Emily Gibbs at the bedside on 10/24/12    PSYCHOSOCIAL DATA Living Status:  FAMILY Admitted from facility:   Level of care:   Primary support name:  Emily Gibbs Primary support relationship to patient:  CHILD, ADULT Degree of support available:   Daughter very involved and acitive in patient's care, along with granddaughter Emily Gibbs who also lives in the home.    CURRENT CONCERNS Current Concerns  Post-Acute Placement   Other Concerns:    SOCIAL WORK ASSESSMENT / PLAN On 10/24/12 CSW talked with patient's daughter and granddaughter regarding discharge plans and recoommendation of ST rehab prior to discharge home. Family provided history and current nature of the total care they provide to patient and indicated that they can continue to do so. CSW given list of additional persons who will be available to assist with patient care as daughter Emily Braun works during the day. Patient also has family in [facility] where they are from who are also very concerned and have been involved in patient's care.    Daughter explained that patient went to [facility] in May for visit with sister and upon returning, patient was less mobile and alert, and  her toe was worst. Short-term rehab was explained, however daughter feels that family can provide the care patient needs.    Daughter asked to speak with CSW later in the day and indicated that she will consider SNF for ST rehab as she is concerned about the stump and the oozing that is occuring at that site. She is concerned about  further injuring this site as they care for patient, so is willing for CSW to send information out to facilties.   Assessment/plan status:  Psychosocial Support/Ongoing Assessment of Needs Other assessment/ plan:   Information/referral to community resources:   Daughter given list of facilities in [facility]    PATIENT'S/FAMILY'S RESPONSE TO PLAN OF CARE: Daughter and granddaughter very open and receptive to talking with CSW about placement and are willing to seriously consider ST rehab for patient.

## 2012-10-25 NOTE — Progress Notes (Signed)
PATIENT DETAILS Name: Emily Gibbs Age: 79 y.o. Sex: female Date of Birth: Mar 03, 1932 Admit Date: 10/19/2012 Admitting Physician Hollice Espy, MD PCP:Pcp Not In System  Subjective: Patient non verbal this morning.  Appears comfortable.  Assessment/Plan: Cellulitis/Gangrene of Right Great Toe  -Dry gangrene of distal tip of right great toe with cellulitis now s/p AKA or RLE -Antibiotics discontinued. -Had some bleeding and serosanguinous drainage again overnight.  Evaluated by the Ortho PA-C who may decide to place a stitch in it later today to decrease the bleeding.  -Will D/C when ok with Orthopedics.  Worsening Anemia -2/2 to peri-operative loss -transfusing 2 units PRBC today -Patient on 3 antiplatelets (aspirin, plavix and pletal).  Will discuss with vascular to see if these can be decreased.  Will d/c pletal for now.  Underlying PVD  -per daughter at bedside-has stents in the bilateral lower ext  -c/w Anti-platelet agents and Statins -Patient on 3 antiplatelets (aspirin, plavix and pletal).  Per Vascular she only needs an 81 mg ASA.  Will D/C Pletal and attempt to determine the history of the antiplatelet medications before discontinuing the plavix  DM -CBG's with moderate control -c/w SSI resistant -added 3 units with meals -increased lantus to 20 units QHS -Appreciate DM Coordinators working with family to answer their questions and make recommendations.  Dementia  -Patient will make eye contact but is minimally verbally responsive -history of chronic Alzheimer's dementia with a decline in responsiveness over the last few weeks  -continue memantine 10 mg po BID, and donepezil 10 mg po QHS  GERD  -seems to be stable  -continue to manage with home meds - pantoprazole 80 mg po daily   Deconditioning  - Per Daughter, patient has been bedbound for close to 2 months. Prior to that she was ambulating with some assistance.  - Will go home with home health  PT  Disposition: Remain inpatient.  Likely d/c 7/10.  Family at bedside wants to take her home.  DVT Prophylaxis: lovenox  Code Status:  DNR  Family Communication Daughter and grand daughter at bedside  Procedures:  ABI 7/4  CONSULTS:  vascular surgery   MEDICATIONS: Scheduled Meds: . aspirin  81 mg Oral Daily  . cilostazol  100 mg Oral BID  . citalopram  15 mg Oral QHS  . clopidogrel  75 mg Oral q morning - 10a  . divalproex  500 mg Oral QHS  . docusate sodium  100 mg Oral Daily  . donepezil  10 mg Oral QHS  . enoxaparin  40 mg Subcutaneous Q24H  . furosemide  20 mg Oral Daily  . insulin aspart  0-20 Units Subcutaneous TID WC  . insulin aspart  0-20 Units Subcutaneous TID WC  . insulin aspart  0-5 Units Subcutaneous QHS  . insulin aspart  3 Units Subcutaneous TID WC  . insulin glargine  20 Units Subcutaneous QHS  . memantine  10 mg Oral BID  . pantoprazole  80 mg Oral Q1200  . simvastatin  40 mg Oral QPM  . sodium chloride  3 mL Intravenous Q12H  . traZODone  150 mg Oral QHS   Continuous Infusions:  PRN Meds:.sodium chloride, acetaminophen, acetaminophen, HYDROmorphone (DILAUDID) injection, ondansetron (ZOFRAN) IV, ondansetron, oxyCODONE, sodium chloride  Antibiotics: Anti-infectives   Start     Dose/Rate Route Frequency Ordered Stop   10/20/12 1400  piperacillin-tazobactam (ZOSYN) IVPB 3.375 g  Status:  Discontinued     3.375 g 12.5 mL/hr over 240 Minutes Intravenous 3 times per  day 10/20/12 1204 10/24/12 0721   10/20/12 1230  vancomycin (VANCOCIN) IVPB 1000 mg/200 mL premix  Status:  Discontinued     1,000 mg 200 mL/hr over 60 Minutes Intravenous Every 24 hours 10/20/12 1204 10/24/12 0721   10/19/12 1415  piperacillin-tazobactam (ZOSYN) IVPB 3.375 g     3.375 g 12.5 mL/hr over 240 Minutes Intravenous  Once 10/19/12 1407 10/19/12 1635   10/19/12 1215  clindamycin (CLEOCIN) capsule 300 mg     300 mg Oral  Once 10/19/12 1202 10/19/12 1340        PHYSICAL EXAM: Vital signs in last 24 hours: Filed Vitals:   10/24/12 1650 10/24/12 1750 10/24/12 2110 10/25/12 0524  BP: 146/68 152/73 138/62 110/49  Pulse: 96 103 82 114  Temp: 98.6 F (37 C) 99.2 F (37.3 C) 98.7 F (37.1 C) 99.9 F (37.7 C)  TempSrc: Oral Oral Oral Axillary  Resp: 18 18 20 18   Height:      Weight:      SpO2: 96% 98% 94% 94%    Weight change:  Filed Weights   10/19/12 1841  Weight: 67.903 kg (149 lb 11.2 oz)   Body mass index is 28.3 kg/(m^2).   Gen Exam: Awake but does not respond verbally.  Being fed by her grand-daughter Neck: Supple, No JVD.   Chest: B/L Clear.  No w/c/r CVS: S1 S2 Regular, 3/6 murmur of AS  Abdomen: soft, BS +, non tender, non distended.  Extremities: RLE s/p AKA wrapped in gauze (clean and dry).  Left lower extremity with pretibial abrasions from recent fall Neurologic: Non Focal.     Intake/Output from previous day:  Intake/Output Summary (Last 24 hours) at 10/25/12 1103 Last data filed at 10/24/12 1750  Gross per 24 hour  Intake 638.83 ml  Output      0 ml  Net 638.83 ml     LAB RESULTS: CBC  Recent Labs Lab 10/19/12 1243  10/21/12 0448 10/23/12 0445 10/24/12 0516 10/24/12 1900 10/25/12 0545  WBC 11.0*  < > 9.3 9.0 11.5* 14.0* 13.3*  HGB 10.9*  < > 10.5* 9.7* 7.8* 10.0* 9.2*  HCT 35.4*  < > 34.0* 31.9* 26.1* 31.6* 29.0*  PLT 222  < > 187 175 180 167 168  MCV 73.6*  < > 72.3* 73.0* 74.1* 74.7* 74.6*  MCH 22.7*  < > 22.3* 22.2* 22.2* 23.6* 23.7*  MCHC 30.8  < > 30.9 30.4 29.9* 31.6 31.7  RDW 15.7*  < > 15.6* 15.5 15.8* 16.3* 16.6*  LYMPHSABS 2.8  --   --   --   --   --   --   MONOABS 1.2*  --   --   --   --   --   --   EOSABS 0.2  --   --   --   --   --   --   BASOSABS 0.0  --   --   --   --   --   --   < > = values in this interval not displayed.  Chemistries   Recent Labs Lab 10/20/12 0520 10/21/12 0448 10/23/12 0445 10/24/12 0516 10/25/12 0545  NA 138 138 137 138 137  K 4.3 3.6 3.7  3.5 3.8  CL 102 100 98 99 98  CO2 28 32 32 35* 33*  GLUCOSE 177* 215* 262* 231* 282*  BUN 7 5* 6 6 8   CREATININE 0.38* 0.45* 0.49* 0.37* 0.44*  CALCIUM 8.2* 8.4 8.1* 8.0* 8.0*  CBG:  Recent Labs Lab 10/24/12 0744 10/24/12 1242 10/24/12 1743 10/24/12 2145 10/25/12 0801  GLUCAP 241* 308* 291* 300* 273*     MICROBIOLOGY: Recent Results (from the past 240 hour(s))  SURGICAL PCR SCREEN     Status: None   Collection Time    10/22/12  2:32 PM      Result Value Range Status   MRSA, PCR NEGATIVE  NEGATIVE Final   Staphylococcus aureus NEGATIVE  NEGATIVE Final   Comment:            The Xpert SA Assay (FDA     approved for NASAL specimens     in patients over 55 years of age),     is one component of     a comprehensive surveillance     program.  Test performance has     been validated by [employer] for patients greater     than or equal to 16 year old.     It is not intended     to diagnose infection nor to     guide or monitor treatment.    RADIOLOGY STUDIES/RESULTS: Ct Head Wo Contrast  10/13/2012   *RADIOLOGY REPORT*  Clinical Data: Altered mental status.  Slurred speech.  Remote history breast cancer.  CT HEAD WITHOUT CONTRAST  Technique:  Contiguous axial images were obtained from the base of the skull through the vertex without contrast.  Comparison: None.  Findings: Remote appearing right occipital lobe infarct noted. Faint calcification along the left globus pallidus nucleus is likely physiologic.  There is chronic microvascular white matter disease.  There is more confluent white matter hypodensity in the left parietal lobe extending to the attenuated left parietal cortex, potentially from chronic or subacute stroke small chronic-appearing left periventricular white matter lacunar infarct noted.  No intracranial hemorrhage or discrete mass lesion is identified. Homogeneous abnormal high density in left globe, query prior injected silicone oil.  There is mild  frothy material in the left sphenoid sinus.  IMPRESSION:  1.  Subacute or chronic left high parietal infarct.  Remote right occipital lobe infarct. 2. Periventricular and corona radiata white matter hypodensities are most compatible with chronic ischemic microvascular white matter disease. 3.  Very homogeneous abnormal high density in left globe.  Although hemorrhage and other conditions can cause this appearance, correlate with any history of injected silicone oil in the globe. 4.  Chronic left sphenoid sinusitis.   Original Report Authenticated By: Gaylyn Rong, M.D.   Mr Foot Left W Wo Contrast  10/20/2012   *RADIOLOGY REPORT*  Clinical Data: Cellulitis and gangrenous left toe.  Diabetes. Query osteomyelitis.  MRI OF THE LEFT FOREFOOT WITHOUT AND WITH CONTRAST  Technique:  Multiplanar, multisequence MR imaging was performed both before and after administration of intravenous contrast.  Contrast: 15mL MULTIHANCE GADOBENATE DIMEGLUMINE 529 MG/ML IV SOLN  Comparison: 07/30/2011  Findings: Despite efforts by the patient and technologist, motion artifact is present on some series of today's examination and could not be totally eliminated.  This reduces diagnostic sensitivity and specificity.  There appears to be reduction of soft tissue in the great toe around the distal half of the distal phalanx concerning for tissue necrosis/ulceration on image 4 of series 10 and image 16 of series 9 there appears to be increased signal in the distal phalanx on inversion recovery weighted images.  However, no overt enhancement is identified in the bone.  No drainable abscess observed. Plantar muscular atrophy noted with low-level  edema tracking along the plantar fascia.  IMPRESSION:  1.  Abnormal increased inversion recovery weighted signal in the distal phalanx of the great toe with surrounding soft tissue defect, but without abnormal enhancement in the phalanx. Admittedly today's exam is limited by the prominent degree of  motion artifact.  Infarct or distal phalangeal devascularization as a cause for lack of enhancement is raised as a possibility, although it seems unlikely that the T1 precontrast fatty marrow would be maintained in this situation.  Accordingly, the results are atypical and suspicious for but not entirely diagnostic of osteomyelitis in the distal phalanx of the great toe. 2.  No drainable abscess observed.   Original Report Authenticated By: Gaylyn Rong, M.D.   Dg Chest Portable 1 View  10/13/2012   *RADIOLOGY REPORT*  Clinical Data: Altered mental status.  Weakness.  History of diabetes and Alzheimer's disease.  PORTABLE CHEST - 1 VIEW  Comparison: Limited correlation is made with an abdominal CT 01/02/2011.  Findings: 1748 hours.  There are low lung volumes with left greater than right basilar air space opacities.  Heart size is normal. There is aortic atherosclerosis.  There may be a small amount of pleural fluid on the left.  Telemetry leads overlie the chest.  IMPRESSION: Patchy bibasilar air space opacities worrisome for aspiration pneumonia.   Original Report Authenticated By: Carey Bullocks, M.D.   Dg Foot Complete Right  10/19/2012   *RADIOLOGY REPORT*  Clinical Data: Wound infection  RIGHT FOOT COMPLETE - 3+ VIEW  Comparison: 09/17/2012  Findings: Three views of the right foot submitted.  Again noted diffuse osteopenia.  No acute fracture or subluxation.  Again note cortical irregularity at the tip of distal phalanx great toe. Osteomyelitis cannot be excluded.  Clinical correlation is necessary.  IMPRESSION: No acute fracture or subluxation.  Again note cortical irregularity at the tip of distal phalanx great toe.  Osteomyelitis cannot be excluded.  Clinical correlation is necessary.   Original Report Authenticated By: Natasha Mead, M.D.    [place] Pager:[phone]  If 7PM-7AM, please contact night-coverage [email or web address] Password [email or web address] 10/25/2012, 11:03  AM   LOS: 6 days

## 2012-10-25 NOTE — Progress Notes (Signed)
Inpatient Diabetes Program Recommendations  AACE/ADA: New Consensus Statement on Inpatient Glycemic Control (2013)  Target Ranges:  Prepandial:   less than 140 mg/dL      Peak postprandial:   less than 180 mg/dL (1-2 hours)      Critically ill patients:  140 - 180 mg/dL   Results for [name], Emily Gibbs (MRN [id number]) as of 10/25/2012 09:23  Ref. Range 10/24/2012 07:44 10/24/2012 12:42 10/24/2012 17:43 10/24/2012 21:45 10/25/2012 08:01  Glucose-Capillary Latest Range: 70-99 mg/dL 409 (H) 811 (H) 914 (H) 300 (H) 273 (H)   Inpatient Diabetes Program Recommendations Insulin - Basal: Please increase Lantus to 20 units QHS. Insulin - Meal Coverage: May want to order Novolog 3 units TID with meals for meal coverage.  Note: Blood glucose over the past 24 hours has ranged from 241-308 mg/dl.  Please increase Lantus to 20 units QHS and may want to order Novolog 3 units TID with meals for meal coverage.  Will continue to follow.  Thanks, Orlando Penner, RN, MSN, CCRN Diabetes Coordinator Inpatient Diabetes Program [phone]

## 2012-10-25 NOTE — Progress Notes (Signed)
Physical Therapy Treatment Patient Details Name: Emily Gibbs MRN: [id number] DOB: 1931-12-27 Today's Date: 10/25/2012 Time: 4098-1191 PT Time Calculation (min): 27 min  PT Assessment / Plan / Recommendation  PT Comments   Met with daughter for education and to answer her questions about home. Educated daughter about positioning of pt in bed.  Explained to daughter the idea of how hoyer lift works but told her that home health would assist her in learning to use the lift at home. Went over some home ex's the daughter can assist the pt with if the pt is able to cognitively participate.  Recommended that daughter wait for home health to be present before getting pt into w/c and that HHPT could help determine positioning or cushion needs for her w/c. Daughter verbalized understanding of all of the above.   Follow Up Recommendations  Home health PT     Does the patient have the potential to tolerate intense rehabilitation     Barriers to Discharge        Equipment Recommendations  Other (comment) (hoyer lift)    Recommendations for Other Services    Frequency Min 2X/week   Progress towards PT Goals Progress towards PT goals: Progressing toward goals  Plan Discharge plan needs to be updated    Precautions / Restrictions     Pertinent Vitals/Pain N/A    Mobility       Exercises     PT Diagnosis:    PT Problem List:   PT Treatment Interventions:     PT Goals (current goals can now be found in the care plan section)    Visit Information  Last PT Received On: 10/25/12 Assistance Needed: +2 History of Present Illness: Pt is a 79 yo female with past history of diabetes mellitus, peripheral vascular disease and Alzheimer's dementia admitted with right great toe infection progressing up into foot. 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    Subjective Data      Cognition       Balance     End of Session PT - End of Session Activity Tolerance: Other (comment) (Pt sleeping.  Education for family  performed.) Patient left: in bed;with family/visitor present   GP     [facility] 10/25/2012, 12:48 PM  [facility] PT [phone]

## 2012-10-25 NOTE — Progress Notes (Signed)
   CARE MANAGEMENT NOTE 10/25/2012  Patient:  [name],[name]   Account Number:  [id number]  Date Initiated:  10/25/2012  Documentation initiated by:  [facility]  Subjective/Objective Assessment:   Right above-knee amputation, gangrene right foot     Action/Plan:   lives at home with Emily Gibbs # [phone]   Anticipated DC Date:  10/26/2012   Anticipated DC Plan:  HOME W HOME HEALTH SERVICES      DC Planning Services  CM consult      [facility] Choice  HOME HEALTH  Resumption Of Svcs/PTA Provider   Choice offered to / List presented to:  C-4 Adult Children        HH arranged  HH-1 RN  HH-2 PT  HH-4 NURSE'S AIDE  HH-3 OT  HH-6 SOCIAL WORKER      HH agency  [facility]   Status of service:  Completed, signed off Medicare Important Message given?   (If response is "NO", the following Medicare IM given date fields will be blank) Date Medicare IM given:   Date Additional Medicare IM given:    Discharge Disposition:  HOME W HOME HEALTH SERVICES  Per UR Regulation:    If discussed at Long Length of Stay Meetings, dates discussed:    Comments:  10/25/2012 1615 [facility] aware of scheduled dc home on 10/26/2012. Will need ambulance transport at time of dc. Contacted [facility] for [employer]. Isidoro Donning RN CCM Case Mgmt phone [phone]

## 2012-10-25 NOTE — Progress Notes (Signed)
Inpatient Diabetes Program Recommendations  AACE/ADA: New Consensus Statement on Inpatient Glycemic Control (2013)  Target Ranges:  Prepandial:   less than 140 mg/dL      Peak postprandial:   less than 180 mg/dL (1-2 hours)      Critically ill patients:  140 - 180 mg/dL   Reason for Visit: Diabetes F/U regarding home meds  Pt's daughter states pt has been taking 75/25 10 units ac Breakfast and 10 units QHS. Daughter states she checks blood sugars 3 - 4 times/day and it's been in the 200-300s.     Results for ELIDE, STALZER (MRN [id number]) as of 10/25/2012 17:07  Ref. Range 10/24/2012 17:43 10/24/2012 21:45 10/25/2012 08:01 10/25/2012 11:50 10/25/2012 16:38  Glucose-Capillary Latest Range: 70-99 mg/dL 295 (H) 284 (H) 132 (H) 257 (H) 185 (H)   Recommendations: Pt uses insulin pen instead of syringe at home. Change insulin to 75/25 15 units bid (ac breakfast and ac dinner) Will need to titrate if blood sugars >200 mg/dL. Would also add Novolog moderate correction to discharge meds.  Family is checking blood sugars before meals and bedtime. Discussed hypoglycemia symptoms and treatment with dtr. Instructed dtr on giving 75/25 insulin before meals instead of HS.  Daughter voiced understanding and prefers pt to be discharged on 75/25 instead of Lantus since pt has been on this at home.  Will f/u in am. Thank you. Ailene Ards, RD, LDN, CDE Inpatient Diabetes Coordinator [phone]

## 2012-10-26 LAB — CBC
Hemoglobin: 7.4 g/dL — ABNORMAL LOW (ref 12.0–15.0)
MCH: 23.6 pg — ABNORMAL LOW (ref 26.0–34.0)
MCHC: 31.5 g/dL (ref 30.0–36.0)
MCV: 75.1 fL — ABNORMAL LOW (ref 78.0–100.0)
Platelets: 166 10*3/uL (ref 150–400)

## 2012-10-26 LAB — CREATININE, SERUM
Creatinine, Ser: 0.45 mg/dL — ABNORMAL LOW (ref 0.50–1.10)
GFR calc non Af Amer: >90 mL/min (ref >90)

## 2012-10-26 LAB — GLUCOSE, CAPILLARY: Glucose-Capillary: 213 mg/dL — ABNORMAL HIGH (ref 70–99)

## 2012-10-26 MED ORDER — FUROSEMIDE 10 MG/ML IJ SOLN
40.0000 mg | Freq: Once | INTRAMUSCULAR | Status: AC
  Administered 2012-10-26: 40 mg via INTRAVENOUS
  Filled 2012-10-26: qty 4

## 2012-10-26 MED ORDER — INSULIN ASPART PROT & ASPART (70-30 MIX) 100 UNIT/ML ~~LOC~~ SUSP
15.0000 [IU] | Freq: Two times a day (BID) | SUBCUTANEOUS | Status: DC
  Administered 2012-10-26 – 2012-10-27 (×4): 15 [IU] via SUBCUTANEOUS
  Filled 2012-10-26: qty 10

## 2012-10-26 MED ORDER — INSULIN ASPART 100 UNIT/ML ~~LOC~~ SOLN
0.0000 [IU] | Freq: Three times a day (TID) | SUBCUTANEOUS | Status: DC
  Administered 2012-10-26: 5 [IU] via SUBCUTANEOUS
  Administered 2012-10-26: 15 [IU] via SUBCUTANEOUS
  Administered 2012-10-26: 3 [IU] via SUBCUTANEOUS
  Administered 2012-10-27 (×2): 5 [IU] via SUBCUTANEOUS
  Administered 2012-10-27: 8 [IU] via SUBCUTANEOUS

## 2012-10-26 NOTE — Care Management Note (Addendum)
    Page 1 of 2   10/27/2012     3:55:39 PM   CARE MANAGEMENT NOTE 10/27/2012  Patient:  Emily Gibbs,Emily Gibbs   Account Number:  [id number]  Date Initiated:  10/25/2012  Documentation initiated by:  [facility]  Subjective/Objective Assessment:   Right above-knee amputation, gangrene right foot     Action/Plan:   lives at home with Kathee Polite # [phone]   Anticipated DC Date:  10/26/2012   Anticipated DC Plan:  SKILLED NURSING FACILITY  In-house referral  Clinical Social Worker      DC Associate Professor  CM consult      [facility] Choice  HOME HEALTH  Resumption Of Svcs/PTA Provider   Choice offered to / List presented to:  C-4 Adult Children        HH arranged  HH-1 RN  HH-2 PT  HH-4 NURSE'S AIDE  HH-3 OT  HH-6 SOCIAL WORKER      HH agency  [facility]   Status of service:  Completed, signed off Medicare Important Message given?   (If response is "NO", the following Medicare IM given date fields will be blank) Date Medicare IM given:   Date Additional Medicare IM given:    Discharge Disposition:  SKILLED NURSING FACILITY  Per UR Regulation:  Reviewed for med. necessity/level of care/duration of stay  If discussed at Long Length of Stay Meetings, dates discussed:    Comments:  10/27/12 15:54 Letha Cape RN, BSN [phone] patient did get a bed at [facility] per Charge Nurse , informed Debbie with [facility], pt is for dc to SNF today.  10/26/12  10:47 Letha Cape RN, BSN [phone] patient 's daughter has decided that SNF would actually be better for patient, CSW contacted , info faxed out already, CSW ready to present bed offers.  Daughter states if they are not able to get a SNF for patient then they will still take her home with [facility], will notify [facility].  10/25/2012 1615 [facility] aware of scheduled dc home on 10/26/2012. Will need ambulance transport at time of dc. Contacted [facility] for [employer]. Isidoro Donning RN CCM Case Mgmt phone [phone]

## 2012-10-26 NOTE — Progress Notes (Signed)
PATIENT DETAILS Name: Emily Gibbs Age: 79 y.o. Sex: female Date of Birth: 12-Jun-1931 Admit Date: 10/19/2012 Admitting Physician Hollice Espy, MD PCP:Pcp Not In System  Subjective: Patient non verbal this morning.  Appears comfortable. Being fed by her grand-daughter.  Family at bedside.  Assessment/Plan: Cellulitis/Gangrene of Right Great Toe  -Dry gangrene of distal tip of right great toe with cellulitis now s/p AKA or RLE -Antibiotics discontinued. -Had some bleeding and serosanguinous drainage.  Evaluated by the Ortho.  A smalll suture was placed 7/9 that has decreased the drainage significantly. -OK to D/C per Orthopedics.  Will follow up with Dr. Arbie Cookey in 4 weeks.  Worsening Anemia -2/2 to peri-operative loss -transfused 2 units of PRBCs on 7/8.  Will transfuse 2 more units today (7/10) -Patient on 3 antiplatelets (aspirin, plavix and pletal) at the time of surgery.  These have been decreased to ASA 81 mg alone. -Lovenox discontinued  Underlying PVD  -per daughter at bedside-has stents in the bilateral lower ext  -c/w aspirin 81 mg and Statins  DM -CBG's with moderate control -Novolog 70/30 15 units BID (this is the hospital substitute for 75-25) -SSI Moderate -Appreciate DM Coordinators working with family to answer their questions and make recommendations.  Dementia  -Patient will make eye contact but is minimally verbally responsive -history of chronic Alzheimer's dementia with a decline in responsiveness over the last few weeks  -continue memantine 10 mg po BID, and donepezil 10 mg po QHS  GERD  -seems to be stable  -continue to manage with home meds - pantoprazole 80 mg po daily   Deconditioning  - Per Daughter, patient has been bedbound for close to 2 months. Prior to that she was ambulating with some assistance.  - Family has decided to accept offer for SNF if insurance will permit.  Disposition: Remain inpatient.  SNF at d/c.  Ready for D/C.   Likely D/c 711 when bed available.  DVT Prophylaxis: Lovenox discontinued due to bleeding.  Code Status:  DNR  Family Communication Daughter and grand daughter at bedside  Procedures:  ABI 7/4  CONSULTS:  vascular surgery   MEDICATIONS: Scheduled Meds: . aspirin  81 mg Oral Daily  . citalopram  15 mg Oral QHS  . divalproex  500 mg Oral QHS  . docusate sodium  100 mg Oral Daily  . donepezil  10 mg Oral QHS  . enoxaparin  40 mg Subcutaneous Q24H  . furosemide  20 mg Oral Daily  . insulin aspart  0-15 Units Subcutaneous TID WC  . insulin aspart protamine- aspart  15 Units Subcutaneous BID WC  . memantine  10 mg Oral BID  . pantoprazole  80 mg Oral Q1200  . simvastatin  40 mg Oral QPM  . sodium chloride  3 mL Intravenous Q12H  . traZODone  150 mg Oral QHS   Continuous Infusions:  PRN Meds:.sodium chloride, acetaminophen, acetaminophen, HYDROmorphone (DILAUDID) injection, ondansetron (ZOFRAN) IV, ondansetron, oxyCODONE, sodium chloride  Antibiotics: Anti-infectives   Start     Dose/Rate Route Frequency Ordered Stop   10/20/12 1400  piperacillin-tazobactam (ZOSYN) IVPB 3.375 g  Status:  Discontinued     3.375 g 12.5 mL/hr over 240 Minutes Intravenous 3 times per day 10/20/12 1204 10/24/12 0721   10/20/12 1230  vancomycin (VANCOCIN) IVPB 1000 mg/200 mL premix  Status:  Discontinued     1,000 mg 200 mL/hr over 60 Minutes Intravenous Every 24 hours 10/20/12 1204 10/24/12 0721   10/19/12 1415  piperacillin-tazobactam (ZOSYN) IVPB  3.375 g     3.375 g 12.5 mL/hr over 240 Minutes Intravenous  Once 10/19/12 1407 10/19/12 1635   10/19/12 1215  clindamycin (CLEOCIN) capsule 300 mg     300 mg Oral  Once 10/19/12 1202 10/19/12 1340       PHYSICAL EXAM: Vital signs in last 24 hours: Filed Vitals:   10/26/12 1030 10/26/12 1130 10/26/12 1227 10/26/12 1230  BP: 111/72 129/76 128/83 114/74  Pulse: 103 97 99 93  Temp: 99 F (37.2 C) 99.1 F (37.3 C) 98.4 F (36.9 C) 98.7 F  (37.1 C)  TempSrc: Axillary Axillary Axillary Axillary  Resp: 16 18 18 18   Height:      Weight:      SpO2:        Weight change:  Filed Weights   10/19/12 1841  Weight: 67.903 kg (149 lb 11.2 oz)   Body mass index is 28.3 kg/(m^2).   Gen Exam: Awake but does not respond verbally.  Being fed by her grand-daughter Neck: Supple, No JVD.   Chest: B/L Clear.  No w/c/r CVS: S1 S2 Regular, 3/6 murmur of AS  Abdomen: soft, BS +, non tender, non distended.  Extremities: RLE s/p AKA wrapped in gauze (clean and dry).  Left lower extremity with pretibial abrasions from recent fall.  Hands slightly edematous. Neurologic: Non Focal.     LAB RESULTS: CBC  Recent Labs Lab 10/23/12 0445 10/24/12 0516 10/24/12 1900 10/25/12 0545 10/26/12 0545  WBC 9.0 11.5* 14.0* 13.3* 10.4  HGB 9.7* 7.8* 10.0* 9.2* 7.4*  HCT 31.9* 26.1* 31.6* 29.0* 23.5*  PLT 175 180 167 168 166  MCV 73.0* 74.1* 74.7* 74.6* 75.1*  MCH 22.2* 22.2* 23.6* 23.7* 23.6*  MCHC 30.4 29.9* 31.6 31.7 31.5  RDW 15.5 15.8* 16.3* 16.6* 16.7*    Chemistries   Recent Labs Lab 10/20/12 0520 10/21/12 0448 10/23/12 0445 10/24/12 0516 10/25/12 0545 10/26/12 0545  NA 138 138 137 138 137  --   K 4.3 3.6 3.7 3.5 3.8  --   CL 102 100 98 99 98  --   CO2 28 32 32 35* 33*  --   GLUCOSE 177* 215* 262* 231* 282*  --   BUN 7 5* 6 6 8   --   CREATININE 0.38* 0.45* 0.49* 0.37* 0.44* 0.45*  CALCIUM 8.2* 8.4 8.1* 8.0* 8.0*  --     CBG:  Recent Labs Lab 10/25/12 1150 10/25/12 1638 10/25/12 2057 10/26/12 0751 10/26/12 1159  GLUCAP 257* 185* 218* 255* 162*     MICROBIOLOGY: Recent Results (from the past 240 hour(s))  SURGICAL PCR SCREEN     Status: None   Collection Time    10/22/12  2:32 PM      Result Value Range Status   MRSA, PCR NEGATIVE  NEGATIVE Final   Staphylococcus aureus NEGATIVE  NEGATIVE Final   Comment:            The Xpert SA Assay (FDA     approved for NASAL specimens     in patients over 21 years  of age),     is one component of     a comprehensive surveillance     program.  Test performance has     been validated by [employer] for patients greater     than or equal to 64 year old.     It is not intended     to diagnose infection nor to  guide or monitor treatment.    RADIOLOGY STUDIES/RESULTS: Ct Head Wo Contrast  10/13/2012   *RADIOLOGY REPORT*  Clinical Data: Altered mental status.  Slurred speech.  Remote history breast cancer.  CT HEAD WITHOUT CONTRAST  Technique:  Contiguous axial images were obtained from the base of the skull through the vertex without contrast.  Comparison: None.  Findings: Remote appearing right occipital lobe infarct noted. Faint calcification along the left globus pallidus nucleus is likely physiologic.  There is chronic microvascular white matter disease.  There is more confluent white matter hypodensity in the left parietal lobe extending to the attenuated left parietal cortex, potentially from chronic or subacute stroke small chronic-appearing left periventricular white matter lacunar infarct noted.  No intracranial hemorrhage or discrete mass lesion is identified. Homogeneous abnormal high density in left globe, query prior injected silicone oil.  There is mild frothy material in the left sphenoid sinus.  IMPRESSION:  1.  Subacute or chronic left high parietal infarct.  Remote right occipital lobe infarct. 2. Periventricular and corona radiata white matter hypodensities are most compatible with chronic ischemic microvascular white matter disease. 3.  Very homogeneous abnormal high density in left globe.  Although hemorrhage and other conditions can cause this appearance, correlate with any history of injected silicone oil in the globe. 4.  Chronic left sphenoid sinusitis.   Original Report Authenticated By: Gaylyn Rong, M.D.   Mr Foot Left W Wo Contrast  10/20/2012   *RADIOLOGY REPORT*  Clinical Data: Cellulitis and gangrenous left toe.   Diabetes. Query osteomyelitis.  MRI OF THE LEFT FOREFOOT WITHOUT AND WITH CONTRAST  Technique:  Multiplanar, multisequence MR imaging was performed both before and after administration of intravenous contrast.  Contrast: 15mL MULTIHANCE GADOBENATE DIMEGLUMINE 529 MG/ML IV SOLN  Comparison: 07/30/2011  Findings: Despite efforts by the patient and technologist, motion artifact is present on some series of today's examination and could not be totally eliminated.  This reduces diagnostic sensitivity and specificity.  There appears to be reduction of soft tissue in the great toe around the distal half of the distal phalanx concerning for tissue necrosis/ulceration on image 4 of series 10 and image 16 of series 9 there appears to be increased signal in the distal phalanx on inversion recovery weighted images.  However, no overt enhancement is identified in the bone.  No drainable abscess observed. Plantar muscular atrophy noted with low-level edema tracking along the plantar fascia.  IMPRESSION:  1.  Abnormal increased inversion recovery weighted signal in the distal phalanx of the great toe with surrounding soft tissue defect, but without abnormal enhancement in the phalanx. Admittedly today's exam is limited by the prominent degree of motion artifact.  Infarct or distal phalangeal devascularization as a cause for lack of enhancement is raised as a possibility, although it seems unlikely that the T1 precontrast fatty marrow would be maintained in this situation.  Accordingly, the results are atypical and suspicious for but not entirely diagnostic of osteomyelitis in the distal phalanx of the great toe. 2.  No drainable abscess observed.   Original Report Authenticated By: Gaylyn Rong, M.D.   Dg Chest Portable 1 View  10/13/2012   *RADIOLOGY REPORT*  Clinical Data: Altered mental status.  Weakness.  History of diabetes and Alzheimer's disease.  PORTABLE CHEST - 1 VIEW  Comparison: Limited correlation is made with  an abdominal CT 01/02/2011.  Findings: 1748 hours.  There are low lung volumes with left greater than right basilar air space opacities.  Heart size is  normal. There is aortic atherosclerosis.  There may be a small amount of pleural fluid on the left.  Telemetry leads overlie the chest.  IMPRESSION: Patchy bibasilar air space opacities worrisome for aspiration pneumonia.   Original Report Authenticated By: Carey Bullocks, M.D.   Dg Foot Complete Right  10/19/2012   *RADIOLOGY REPORT*  Clinical Data: Wound infection  RIGHT FOOT COMPLETE - 3+ VIEW  Comparison: 09/17/2012  Findings: Three views of the right foot submitted.  Again noted diffuse osteopenia.  No acute fracture or subluxation.  Again note cortical irregularity at the tip of distal phalanx great toe. Osteomyelitis cannot be excluded.  Clinical correlation is necessary.  IMPRESSION: No acute fracture or subluxation.  Again note cortical irregularity at the tip of distal phalanx great toe.  Osteomyelitis cannot be excluded.  Clinical correlation is necessary.   Original Report Authenticated By: Natasha Mead, M.D.    [place] [facility] Pager:[phone]  If 7PM-7AM, please contact night-coverage [email or web address] Password [email or web address] 10/26/2012, 1:00 PM   LOS: 7 days

## 2012-10-26 NOTE — Progress Notes (Signed)
Patient ID: Emily Gibbs, female   DOB: [birth year], 79 y.o.   MRN: [id number] Comfortable. AKA dressing intact with no further bleeding. He received a transfusion for anemia. Stable from a discharge from vascular standpoint.

## 2012-10-26 NOTE — Clinical Social Work Note (Signed)
CSW gave patient's daughter [name] bed offers today and [facility] chosen. Per admissions director Tresa Endo, clinicals submitted and authorization can take up to 2 days. Tresa Endo also advised CSW of co-pay information and requested information be shared with daughter. MD advised of d/c delay and it was determined that if authorization not received Friday, patient will d/c home. CSW talked with daughter and advised of d/c delay and the possibility of patient discharging home on Friday if authorization not rec'd and daughter expressed understanding.  Genelle Bal, MSW, LCSW [phone]

## 2012-10-26 NOTE — Progress Notes (Signed)
Increase in drainage from right AKA. Bloody. Notified attending physician. Instructed to called vascular. Paged PA on call. PA in OR will come by later to evaluate. Gave order to remove dressing and evaluate and redress.  Madelin Rear, MSN, RN, [employer]

## 2012-10-26 NOTE — Progress Notes (Signed)
Addendum  Patient seen and examined, chart and data base reviewed.  I agree with the above assessment and plan.  For full details please see Mrs. Algis Downs PA note.  Stitches reinforced yesterday by the orthopedic PA, per nursing staff patient is still oozing.   Clint Lipps, MD [facility] Pager: [phone] 10/26/2012, 2:30 PM

## 2012-10-26 NOTE — Progress Notes (Signed)
      [facility]           [facility] [place]  Called to see pt again for bleeding from right AKA stump. Staples placed yesterday in skin where bleeding was noted. Today there is more serous drainage. Receiving blood for Hgb 7.4.  Pt appears comfortable.  CBC    Component Value Date/Time   WBC 10.4 10/26/2012 0545   RBC 3.13* 10/26/2012 0545   HGB 7.4* 10/26/2012 0545   HCT 23.5* 10/26/2012 0545   PLT 166 10/26/2012 0545   MCV 75.1* 10/26/2012 0545   MCH 23.6* 10/26/2012 0545   MCHC 31.5 10/26/2012 0545   RDW 16.7* 10/26/2012 0545   LYMPHSABS 2.8 10/19/2012 1243   MONOABS 1.2* 10/19/2012 1243   EOSABS 0.2 10/19/2012 1243   BASOSABS 0.0 10/19/2012 1243   PE:  Right AKA dressing removed  No active bleeding noted There was a large clot on dressing Most of drainage was serous  A/P Serous drainage with old clot on right AKA dressing There was no active bleeding in area where additional staples had been placed Drainage may also be edema fluid Pt receiving blood for acute blood loss anemia dressing changes prn Call if further assistance needed

## 2012-10-27 ENCOUNTER — Encounter (HOSPITAL_BASED_OUTPATIENT_CLINIC_OR_DEPARTMENT_OTHER): Payer: Medicare Other | Attending: General Surgery

## 2012-10-27 LAB — TYPE AND SCREEN
Antibody Screen: NEGATIVE
Unit division: 0
Unit division: 0
Unit division: 0

## 2012-10-27 LAB — CBC
HCT: 33.1 % — ABNORMAL LOW (ref 36.0–46.0)
Hemoglobin: 10.7 g/dL — ABNORMAL LOW (ref 12.0–15.0)
MCH: 25.6 pg — ABNORMAL LOW (ref 26.0–34.0)
MCHC: 32.3 g/dL (ref 30.0–36.0)

## 2012-10-27 LAB — GLUCOSE, CAPILLARY: Glucose-Capillary: 202 mg/dL — ABNORMAL HIGH (ref 70–99)

## 2012-10-27 MED ORDER — CLOTRIMAZOLE 1 % VA CREA: 1.0000 | TOPICAL_CREAM | Freq: Every day | VAGINAL | Status: DC

## 2012-10-27 MED ORDER — CLOTRIMAZOLE 1 % VA CREA
1.0000 | TOPICAL_CREAM | Freq: Every day | VAGINAL | Status: DC
  Administered 2012-10-27: 1 via VAGINAL
  Filled 2012-10-27: qty 45

## 2012-10-27 NOTE — Clinical Social Work Placement (Signed)
Clinical Social Work Department CLINICAL SOCIAL WORK PLACEMENT NOTE 10/27/2012  Patient:  Emily Gibbs,Emily Gibbs  Account Number:  [id number] Admit date:  10/19/2012  Clinical Social Worker:  Genelle Bal, LCSW  Date/time:  10/27/2012 05:54 AM  Clinical Social Work is seeking post-discharge placement for this patient at the following level of care:   SKILLED NURSING   (*CSW will update this form in Epic as items are completed)   10/25/2012  Patient/family provided with [facility] Department of Clinical Social Work's list of facilities offering this level of care within the geographic area requested by the patient (or if unable, by the patient's family).  10/25/2012  Patient/family informed of their freedom to choose among providers that offer the needed level of care, that participate in Medicare, Medicaid or managed care program needed by the patient, have an available bed and are willing to accept the patient.    Patient/family informed of [facility]' ownership interest in [facility], as well as of the fact that they are under no obligation to receive care at this facility.  PASARR submitted to EDS on 10/26/2012 PASARR number received from EDS on 10/26/2012  FL2 transmitted to all facilities in geographic area requested by pt/family on  10/25/2012 FL2 transmitted to all facilities within larger geographic area on   Patient informed that his/her managed care company has contracts with or will negotiate with  certain facilities, including the following:     Patient/family informed of bed offers received:  10/26/2012 Patient chooses bed at [facility] Physician recommends and patient chooses bed at    Patient to be transferred [facility] on  10/27/2012 Patient to be transferred to facility by ambulance  The following physician request were entered in Epic:   Additional Comments: 10/27/12 - Skilled facility sent clinicals to  insurance company on 7/10 and approved rec'd the afternoon of 7/11.

## 2012-10-27 NOTE — Progress Notes (Signed)
Inpatient Diabetes Program Recommendations  AACE/ADA: New Consensus Statement on Inpatient Glycemic Control (2013)  Target Ranges:  Prepandial:   less than 140 mg/dL      Peak postprandial:   less than 180 mg/dL (1-2 hours)      Critically ill patients:  140 - 180 mg/dL   Reason for Visit: Hyperglycemia  Results for KORINE, WINTON (MRN [id number]) as of 10/27/2012 10:21  Ref. Range 10/26/2012 07:51 10/26/2012 11:59 10/26/2012 16:58 10/26/2012 21:23 10/27/2012 07:53  Glucose-Capillary Latest Range: 70-99 mg/dL 409 (H) 811 (H) 914 (H) 213 (H) 222 (H)   Recommendation:  Increase 70/30 20 units bid.   Note: Will continue to follow.  Thank you. Ailene Ards, RD, LDN, CDE Inpatient Diabetes Coordinator [phone]

## 2012-10-27 NOTE — Progress Notes (Signed)
Emily Gibbs to be D/C'd Skilled nursing facility per MD order.  Discussed with the patient and all questions fully answered.    Medication List    STOP taking these medications       ALEVE 220 MG Caps  Generic drug:  Naproxen Sodium     cilostazol 100 MG tablet  Commonly known as:  PLETAL     clopidogrel 75 MG tablet  Commonly known as:  PLAVIX     insulin lispro protamine-lispro (75-25) 100 UNIT/ML Susp  Commonly known as:  HUMALOG 75/25     OVER THE COUNTER MEDICATION      TAKE these medications       acetaminophen 325 MG tablet  Commonly known as:  TYLENOL  Take 2 tablets (650 mg total) by mouth every 6 (six) hours as needed.     aspirin 81 MG tablet  Take 81 mg by mouth daily.     citalopram 10 MG tablet  Commonly known as:  CELEXA  Take 15 mg by mouth at bedtime.     clotrimazole 1 % vaginal cream  Commonly known as:  GYNE-LOTRIMIN  Place 1 Applicatorful vaginally daily.     divalproex 500 MG DR tablet  Commonly known as:  DEPAKOTE  Take 500 mg by mouth at bedtime.     donepezil 10 MG tablet  Commonly known as:  ARICEPT  Take 10 mg by mouth at bedtime.     esomeprazole 40 MG capsule  Commonly known as:  NEXIUM  Take 40 mg by mouth daily before breakfast.     fish oil-omega-3 fatty acids 1000 MG capsule  Take 2 g by mouth every morning.     furosemide 20 MG tablet  Commonly known as:  LASIX  Take 20 mg by mouth daily.     insulin lispro 100 UNIT/ML injection  Commonly known as:  HUMALOG  Inject 10 Units into the skin 3 (three) times daily before meals.     memantine 10 MG tablet  Commonly known as:  NAMENDA  Take 10 mg by mouth 2 (two) times daily.     oxyCODONE 5 MG immediate release tablet  Commonly known as:  Oxy IR/ROXICODONE  Take 1-2 tablets (5-10 mg total) by mouth every 6 (six) hours as needed.     simvastatin 40 MG tablet  Commonly known as:  ZOCOR  Take 40 mg by mouth every evening.     traZODone 100 MG tablet  Commonly  known as:  DESYREL  Take 150 mg by mouth at bedtime.     vitamin E 1000 UNIT capsule  Take 1,000 Units by mouth daily.        VVS, Skin clean, dry and intact without evidence of skin break down, no evidence of skin tears noted. IV catheter discontinued intact. Site without signs and symptoms of complications. Dressing and pressure applied.  An After Visit Summary was printed and given to the patient. Patient escorted via stretcher, and D/C to [facility] via [facility].  Kennyth Arnold D 10/27/2012 4:19 PM

## 2012-11-20 ENCOUNTER — Encounter: Payer: Self-pay | Admitting: Vascular Surgery

## 2012-11-21 ENCOUNTER — Ambulatory Visit (INDEPENDENT_AMBULATORY_CARE_PROVIDER_SITE_OTHER): Payer: Medicare Other | Admitting: Vascular Surgery

## 2012-11-21 ENCOUNTER — Encounter: Payer: Self-pay | Admitting: Vascular Surgery

## 2012-11-21 VITALS — BP 122/59 | HR 92 | Resp 16 | Ht 62.0 in | Wt 153.0 lb

## 2012-11-21 DIAGNOSIS — I739 Peripheral vascular disease, unspecified: Secondary | ICD-10-CM | POA: Insufficient documentation

## 2012-11-21 DIAGNOSIS — Z4802 Encounter for removal of sutures: Secondary | ICD-10-CM | POA: Insufficient documentation

## 2012-11-21 NOTE — Progress Notes (Signed)
Patient presents today for followup of her right above-knee amputation from 10/23/2012. She has severe dementia and is nonambulatory and had gangrene of her right foot. She underwent primary right above-knee amputation and has done quite well with this. She has completely healed her amputation will have staples removed today. She does have what appears to be a possible fungal infection in the left great toe with no evidence of erythema or other problems. She apparently has had stenting of her peripheral arteries on the left and [place]. I discussed this with the daughter and would not recommend any ongoing followup since she is completely nonambulatory in transfer from bed to chair. There does not appear to be in critical limb ischemia. I do not palpate pedal pulses but she is warm and well-perfused foot with no pain. She will see [place] on an as-needed basis

## 2014-02-06 ENCOUNTER — Inpatient Hospital Stay (HOSPITAL_COMMUNITY)
Admission: EM | Admit: 2014-02-06 | Discharge: 2014-02-17 | DRG: 871 | Disposition: E | Payer: Medicare (Managed Care) | Attending: Internal Medicine | Admitting: Internal Medicine

## 2014-02-06 ENCOUNTER — Emergency Department (HOSPITAL_COMMUNITY): Payer: Medicare (Managed Care)

## 2014-02-06 ENCOUNTER — Encounter (HOSPITAL_COMMUNITY): Payer: Self-pay | Admitting: Emergency Medicine

## 2014-02-06 DIAGNOSIS — F0390 Unspecified dementia without behavioral disturbance: Secondary | ICD-10-CM

## 2014-02-06 DIAGNOSIS — I739 Peripheral vascular disease, unspecified: Secondary | ICD-10-CM | POA: Diagnosis present

## 2014-02-06 DIAGNOSIS — Z66 Do not resuscitate: Secondary | ICD-10-CM | POA: Diagnosis present

## 2014-02-06 DIAGNOSIS — Z89611 Acquired absence of right leg above knee: Secondary | ICD-10-CM

## 2014-02-06 DIAGNOSIS — Z7982 Long term (current) use of aspirin: Secondary | ICD-10-CM

## 2014-02-06 DIAGNOSIS — J690 Pneumonitis due to inhalation of food and vomit: Secondary | ICD-10-CM | POA: Diagnosis present

## 2014-02-06 DIAGNOSIS — IMO0002 Reserved for concepts with insufficient information to code with codable children: Secondary | ICD-10-CM | POA: Diagnosis present

## 2014-02-06 DIAGNOSIS — E1151 Type 2 diabetes mellitus with diabetic peripheral angiopathy without gangrene: Secondary | ICD-10-CM | POA: Diagnosis present

## 2014-02-06 DIAGNOSIS — A419 Sepsis, unspecified organism: Secondary | ICD-10-CM | POA: Diagnosis present

## 2014-02-06 DIAGNOSIS — J9601 Acute respiratory failure with hypoxia: Secondary | ICD-10-CM | POA: Diagnosis present

## 2014-02-06 DIAGNOSIS — Z515 Encounter for palliative care: Secondary | ICD-10-CM

## 2014-02-06 DIAGNOSIS — I5032 Chronic diastolic (congestive) heart failure: Secondary | ICD-10-CM | POA: Diagnosis present

## 2014-02-06 DIAGNOSIS — G309 Alzheimer's disease, unspecified: Secondary | ICD-10-CM | POA: Diagnosis present

## 2014-02-06 DIAGNOSIS — Z853 Personal history of malignant neoplasm of breast: Secondary | ICD-10-CM

## 2014-02-06 DIAGNOSIS — F0280 Dementia in other diseases classified elsewhere without behavioral disturbance: Secondary | ICD-10-CM | POA: Diagnosis present

## 2014-02-06 DIAGNOSIS — R197 Diarrhea, unspecified: Secondary | ICD-10-CM | POA: Diagnosis present

## 2014-02-06 DIAGNOSIS — G20 Parkinson's disease: Secondary | ICD-10-CM | POA: Diagnosis present

## 2014-02-06 DIAGNOSIS — R0603 Acute respiratory distress: Secondary | ICD-10-CM | POA: Insufficient documentation

## 2014-02-06 DIAGNOSIS — Z79899 Other long term (current) drug therapy: Secondary | ICD-10-CM

## 2014-02-06 DIAGNOSIS — D72829 Elevated white blood cell count, unspecified: Secondary | ICD-10-CM | POA: Diagnosis present

## 2014-02-06 DIAGNOSIS — M1990 Unspecified osteoarthritis, unspecified site: Secondary | ICD-10-CM | POA: Diagnosis present

## 2014-02-06 DIAGNOSIS — Z7401 Bed confinement status: Secondary | ICD-10-CM | POA: Diagnosis not present

## 2014-02-06 DIAGNOSIS — Z794 Long term (current) use of insulin: Secondary | ICD-10-CM | POA: Diagnosis not present

## 2014-02-06 DIAGNOSIS — E1159 Type 2 diabetes mellitus with other circulatory complications: Secondary | ICD-10-CM

## 2014-02-06 DIAGNOSIS — E1165 Type 2 diabetes mellitus with hyperglycemia: Secondary | ICD-10-CM | POA: Diagnosis present

## 2014-02-06 DIAGNOSIS — I350 Nonrheumatic aortic (valve) stenosis: Secondary | ICD-10-CM | POA: Diagnosis present

## 2014-02-06 DIAGNOSIS — F17220 Nicotine dependence, chewing tobacco, uncomplicated: Secondary | ICD-10-CM | POA: Diagnosis present

## 2014-02-06 DIAGNOSIS — J9600 Acute respiratory failure, unspecified whether with hypoxia or hypercapnia: Secondary | ICD-10-CM | POA: Diagnosis present

## 2014-02-06 DIAGNOSIS — J189 Pneumonia, unspecified organism: Secondary | ICD-10-CM

## 2014-02-06 HISTORY — DX: Other complications of anesthesia, initial encounter: T88.59XA

## 2014-02-06 HISTORY — DX: Adverse effect of unspecified anesthetic, initial encounter: T41.45XA

## 2014-02-06 HISTORY — DX: Acute respiratory failure with hypoxia: J96.01

## 2014-02-06 LAB — CBC
HCT: 42.5 % (ref 36.0–46.0)
Hemoglobin: 13 g/dL (ref 12.0–15.0)
MCH: 23.2 pg — ABNORMAL LOW (ref 26.0–34.0)
MCHC: 30.6 g/dL (ref 30.0–36.0)
MCV: 75.9 fL — AB (ref 78.0–100.0)
Platelets: 246 10*3/uL (ref 150–400)
RBC: 5.6 MIL/uL — AB (ref 3.87–5.11)
RDW: 16.2 % — AB (ref 11.5–15.5)
WBC: 22.1 10*3/uL — AB (ref 4.0–10.5)

## 2014-02-06 LAB — COMPREHENSIVE METABOLIC PANEL
ALT: 7 U/L (ref 0–35)
AST: 12 U/L (ref 0–37)
Albumin: 2.5 g/dL — ABNORMAL LOW (ref 3.5–5.2)
Alkaline Phosphatase: 79 U/L (ref 39–117)
Anion gap: 11 (ref 5–15)
BUN: 9 mg/dL (ref 6–23)
CO2: 31 mEq/L (ref 19–32)
Calcium: 8.2 mg/dL — ABNORMAL LOW (ref 8.4–10.5)
Chloride: 96 mEq/L (ref 96–112)
Creatinine, Ser: 0.54 mg/dL (ref 0.50–1.10)
GFR calc Af Amer: >90 mL/min (ref >90)
GFR calc non Af Amer: 87 mL/min — ABNORMAL LOW (ref >90)
Glucose, Bld: 240 mg/dL — ABNORMAL HIGH (ref 70–99)
Potassium: 3.6 mEq/L — ABNORMAL LOW (ref 3.7–5.3)
Sodium: 138 mEq/L (ref 137–147)
Total Bilirubin: <0.2 mg/dL — ABNORMAL LOW (ref 0.3–1.2)
Total Protein: 6.2 g/dL (ref 6.0–8.3)

## 2014-02-06 LAB — PRO B NATRIURETIC PEPTIDE: Pro B Natriuretic peptide (BNP): 8764 pg/mL — ABNORMAL HIGH (ref 0–450)

## 2014-02-06 LAB — I-STAT TROPONIN, ED: Troponin i, poc: 0.02 ng/mL (ref 0.00–0.08)

## 2014-02-06 LAB — LACTIC ACID, PLASMA: Lactic Acid, Venous: 2 mmol/L (ref 0.5–2.2)

## 2014-02-06 LAB — TROPONIN I: Troponin I: <0.3 ng/mL (ref <0.30)

## 2014-02-06 LAB — CBG MONITORING, ED: GLUCOSE-CAPILLARY: 230 mg/dL — AB (ref 70–99)

## 2014-02-06 MED ORDER — VANCOMYCIN HCL IN DEXTROSE 1-5 GM/200ML-% IV SOLN
1000.0000 mg | INTRAVENOUS | Status: DC
  Administered 2014-02-07 – 2014-02-08 (×2): 1000 mg via INTRAVENOUS
  Filled 2014-02-06 (×3): qty 200

## 2014-02-06 MED ORDER — CEFEPIME HCL 2 G IJ SOLR
2.0000 g | Freq: Once | INTRAMUSCULAR | Status: AC
  Administered 2014-02-06: 2 g via INTRAVENOUS
  Filled 2014-02-06: qty 2

## 2014-02-06 MED ORDER — VANCOMYCIN HCL IN DEXTROSE 1-5 GM/200ML-% IV SOLN
1000.0000 mg | Freq: Once | INTRAVENOUS | Status: AC
Start: 2014-02-06 — End: 2014-02-07
  Administered 2014-02-06: 1000 mg via INTRAVENOUS
  Filled 2014-02-06: qty 200

## 2014-02-06 MED ORDER — DEXTROSE 5 % IV SOLN
1.0000 g | Freq: Three times a day (TID) | INTRAVENOUS | Status: DC
  Administered 2014-02-07 – 2014-02-08 (×5): 1 g via INTRAVENOUS
  Filled 2014-02-06 (×7): qty 1

## 2014-02-06 NOTE — ED Notes (Signed)
EKG completed and given to EDP at bedside.

## 2014-02-06 NOTE — H&P (Addendum)
[facility] History and Physical  Emily Gibbs YIF:[id number] DOB: 1931-10-21 DOA: 01/31/2014  Referring physician: ER physician. PCP: Mathews Argyle, MD  Chief Complaint: Shortness of breath.  History obtained from patient's daughter.  HPI: Emily Gibbs is a 80 y.o. female with history of advanced dementia, aortic stenosis, peripheral vascular disease status post right AKA secondary to infection, diabetes mellitus was brought to the ER the patient was found to be in acute respiratory distress. Patient's daughter states that she saw her at around 7 PM and had gone out for shopping and when she came back around 8 PM patient was found to be in acute respiratory distress. EMS was called and patient was brought to the ER. Patient was placed on BiPAP and chest x-ray shows left lung opacity concerning for pleural effusion versus pneumonia. Patient's labs revealed leukocytosis and elevated BNP. Patient is still on BiPAP and the only follows minimal commands. As per patient's daughter patient has advanced dementia and is usually bed bound. Over the last one week patient has been having recurrent diarrhea but has had no diarrhea today. Patient did not have any nausea vomiting abdominal pain chest pain or any fever chills or productive cough prior to the incident.   Review of Systems: As presented in the history of presenting illness, rest negative.  Past Medical History  Diagnosis Date  . Diabetes mellitus without complication   . Arthritis   . Parkinson disease   . Alzheimer disease   . Dementia   . Aortic stenosis   . Heart murmur   . Cancer     Breast    Past Surgical History  Procedure Laterality Date  . Abdominal hysterectomy    . Mastectomy      Left  . Femoral artery stent    . Amputation Right 10/23/2012    Procedure: AMPUTATION ABOVE KNEE;  Surgeon: Rosetta Posner, MD;  Location: [facility];  Service: Vascular;  Laterality: Right;   Social History:  reports that she has  never smoked. She has quit using smokeless tobacco. Her smokeless tobacco use included Snuff. She reports that she does not drink alcohol or use illicit drugs. Where does patient live home. Can patient participate in ADLs? No.  No Known Allergies  Family History:  Family History  Problem Relation Age of Onset  . Cancer Mother   . Cancer Father   . Cancer Sister   . Heart attack Brother   . Heart disease Daughter     heart Disease before age 40  . Heart attack Son   . Diabetes Maternal Uncle   . Heart attack Maternal Uncle   . Heart attack Sister   . Heart attack Brother   . Diabetes Brother     Bilateral amputation -Leg  . COPD Daughter   . Diabetes Daughter   . Cancer Daughter   . Diabetes Daughter   . Stroke Son       Prior to Admission medications   Medication Sig Start Date End Date Taking? Authorizing Provider  acetaminophen (TYLENOL) 325 MG tablet Take 2 tablets (650 mg total) by mouth every 6 (six) hours as needed. 10/24/12   Melton Alar, PA-C  aspirin 81 MG tablet Take 81 mg by mouth daily.    Historical Provider, MD  citalopram (CELEXA) 10 MG tablet Take 15 mg by mouth at bedtime.     Historical Provider, MD  clotrimazole (GYNE-LOTRIMIN) 1 % vaginal cream Place 1 Applicatorful vaginally daily. 10/27/12   Bobby Rumpf  York, PA-C  divalproex (DEPAKOTE) 500 MG DR tablet Take 500 mg by mouth at bedtime.    Historical Provider, MD  donepezil (ARICEPT) 10 MG tablet Take 10 mg by mouth at bedtime.    Historical Provider, MD  esomeprazole (NEXIUM) 40 MG capsule Take 40 mg by mouth daily before breakfast.    Historical Provider, MD  fish oil-omega-3 fatty acids 1000 MG capsule Take 2 g by mouth every morning.    Historical Provider, MD  furosemide (LASIX) 20 MG tablet Take 20 mg by mouth daily.    Historical Provider, MD  insulin lispro (HUMALOG) 100 UNIT/ML injection Inject 10 Units into the skin 2 (two) times daily. 10/25/12   Bobby Rumpf York, PA-C  memantine (NAMENDA) 10 MG  tablet Take 10 mg by mouth 2 (two) times daily.    Historical Provider, MD  oxyCODONE (OXY IR/ROXICODONE) 5 MG immediate release tablet Take 1-2 tablets (5-10 mg total) by mouth every 6 (six) hours as needed. 10/24/12   Bobby Rumpf York, PA-C  simvastatin (ZOCOR) 40 MG tablet Take 40 mg by mouth every evening.    Historical Provider, MD  traZODone (DESYREL) 100 MG tablet Take 150 mg by mouth at bedtime.    Historical Provider, MD  vitamin E 1000 UNIT capsule Take 1,000 Units by mouth daily.    Historical Provider, MD    Physical Exam: Filed Vitals:   02/05/2014 2045 01/25/2014 2059 02/04/2014 2100 01/19/2014 2115  BP: 135/66  142/55 128/50  Pulse: 98  99 90  Temp:  99.7 F (37.6 C)    TempSrc:  Rectal    Resp: 30  30 26   SpO2: 100%  100% 100%     General:  Moderately built and nourished.  Eyes: Anicteric no pallor.  ENT: No discharge from ears eyes nose mouth.  Neck: No mass felt.  Cardiovascular: S1-S2 heard. Systolic murmur.  Respiratory: No rhonchi or crepitations.  Abdomen: Soft nontender bowel sounds present.  Skin: No rash.  Musculoskeletal: Right AKA.  Psychiatric: Patient is drowsy.  Neurologic: Patient is drowsy but moves extremities but does not follow commands.  Labs on Admission:  Basic Metabolic Panel: No results found for this basename: NA, K, CL, CO2, GLUCOSE, BUN, CREATININE, CALCIUM, MG, PHOS,  in the last 168 hours Liver Function Tests: No results found for this basename: AST, ALT, ALKPHOS, BILITOT, PROT, ALBUMIN,  in the last 168 hours No results found for this basename: LIPASE, AMYLASE,  in the last 168 hours No results found for this basename: AMMONIA,  in the last 168 hours CBC:  Recent Labs Lab 01/20/2014 2038  WBC 22.1*  HGB 13.0  HCT 42.5  MCV 75.9*  PLT 246   Cardiac Enzymes: No results found for this basename: CKTOTAL, CKMB, CKMBINDEX, TROPONINI,  in the last 168 hours  BNP (last 3 results)  Recent Labs  01/30/2014 2039  PROBNP 8764.0*    CBG:  Recent Labs Lab 01/29/2014 2043  GLUCAP 230*    Radiological Exams on Admission: Dg Chest Portable 1 View  02/04/2014   CLINICAL DATA:  80 year old female with acute respiratory distress. Initial encounter. Personal history of left breast cancer.  EXAM: PORTABLE CHEST - 1 VIEW  COMPARISON:  12/22/2012 and earlier.  FINDINGS: Portable AP semi upright view at 2039 hrs. Improved lung volumes. Chronic left lung base hypo ventilation, retrocardiac opacity appears increased. No pneumothorax. Chronic vascular congestion or interstitial opacity is not significantly changed. Mildly increased veiling opacity at the right base. Stable cardiac  size and mediastinal contours. Visualized tracheal air column is within normal limits.  IMPRESSION: Acute on chronic left lung base opacity, suspect in part due to pleural effusion, but left lower lobe aspiration or pneumonia are possible. PA and lateral views would be valuable when possible.   Electronically Signed   By: Lars Pinks M.D.   On: 02/16/2014 21:20    EKG: Independently reviewed. Sinus tachycardia with APCs and nonspecific ST-T changes particularly in the inferior leads.  Assessment/Plan Principal Problem:   Acute respiratory failure with hypoxia Active Problems:   DM (diabetes mellitus), type 2, uncontrolled, periph vascular complic   Dementia   Peripheral vascular disease   Acute respiratory failure   1. Acute respiratory failure with hypoxia - probably either secondary to aspiration pneumonia versus CHF (patient does have loud systolic). At this time patient has been placed on empiric antibiotics for aspiration and also I have ordered Lasix 20 mg IV daily with one dose now. Continue BiPAP. Patient's basic metabolic panel and blood cultures and repeat troponins are pending. Patient's daughter at this time as requested no aggressive measures and including no further labs. To treat patient symptomatically and okay with continuing Lasix and  antibiotics and BiPAP. If patient's condition does not improve then patient is to be made comfort measures only. 2. Diarrhea - check stool for C. difficile. 3. Diabetes mellitus type 2 - on insulin sliding scale. 4. Leukocytosis - see #1. 5. Advanced dementia - continue home medications. Patient is only minimally responsive at this time. Patient's family at this time as requested no further aggressive workup. 6. Peripheral vascular disease status post right AKA.    Code Status: DO NOT RESUSCITATE.  Family Communication: Patient's daughter at the bedside.  Disposition Plan: Admit to inpatient.    KAKRAKANDY,ARSHAD N. [facility] Pager [phone].  If 7PM-7AM, please contact night-coverage [email or web address] Password [email or web address] 01/26/2014, 10:29 PM

## 2014-02-06 NOTE — ED Provider Notes (Signed)
CSN: [id number]     Arrival date & time 01/27/2014  2026 History   First MD Initiated Contact with Patient 02/12/2014 2038     Chief Complaint  Patient presents with  . Respiratory Distress     (Consider location/radiation/quality/duration/timing/severity/associated sxs/prior Treatment) Patient is a 80 y.o. female presenting with shortness of breath.  Shortness of Breath Severity:  Severe Onset quality:  Unable to specify Duration:  2 hours Timing:  Constant Progression:  Worsening Chronicity:  New Context: URI   Relieved by:  Nothing Worsened by:  Nothing tried Ineffective treatments:  Oxygen Associated symptoms: cough and sputum production   Associated symptoms: no diaphoresis and no fever   Risk factors: prolonged immobilization     Past Medical History  Diagnosis Date  . Diabetes mellitus without complication   . Arthritis   . Parkinson disease   . Alzheimer disease   . Dementia   . Aortic stenosis   . Heart murmur   . Cancer     Breast    Past Surgical History  Procedure Laterality Date  . Abdominal hysterectomy    . Mastectomy      Left  . Femoral artery stent    . Amputation Right 10/23/2012    Procedure: AMPUTATION ABOVE KNEE;  Surgeon: Rosetta Posner, MD;  Location: [facility] OR;  Service: Vascular;  Laterality: Right;   Family History  Problem Relation Age of Onset  . Cancer Mother   . Cancer Father   . Cancer Sister   . Heart attack Brother   . Heart disease Daughter     heart Disease before age 98  . Heart attack Son   . Diabetes Maternal Uncle   . Heart attack Maternal Uncle   . Heart attack Sister   . Heart attack Brother   . Diabetes Brother     Bilateral amputation -Leg  . COPD Daughter   . Diabetes Daughter   . Cancer Daughter   . Diabetes Daughter   . Stroke Son    History  Substance Use Topics  . Smoking status: Never Smoker   . Smokeless tobacco: Former Systems developer    Types: Snuff  . Alcohol Use: No   OB History   Grav Para Term Preterm  Abortions TAB SAB Ect Mult Living                 Review of Systems  Unable to perform ROS: Dementia  Constitutional: Negative for fever and diaphoresis.  Respiratory: Positive for cough, sputum production and shortness of breath.       Allergies  Review of patient's allergies indicates no known allergies.  Home Medications   Prior to Admission medications   Medication Sig Start Date End Date Taking? Authorizing Provider  acetaminophen (TYLENOL) 325 MG tablet Take 2 tablets (650 mg total) by mouth every 6 (six) hours as needed. 10/24/12   Melton Alar, PA-C  aspirin 81 MG tablet Take 81 mg by mouth daily.    Historical Provider, MD  citalopram (CELEXA) 10 MG tablet Take 15 mg by mouth at bedtime.     Historical Provider, MD  clotrimazole (GYNE-LOTRIMIN) 1 % vaginal cream Place 1 Applicatorful vaginally daily. 10/27/12   Bobby Rumpf York, PA-C  divalproex (DEPAKOTE) 500 MG DR tablet Take 500 mg by mouth at bedtime.    Historical Provider, MD  donepezil (ARICEPT) 10 MG tablet Take 10 mg by mouth at bedtime.    Historical Provider, MD  esomeprazole (NEXIUM) 40 MG capsule  Take 40 mg by mouth daily before breakfast.    Historical Provider, MD  fish oil-omega-3 fatty acids 1000 MG capsule Take 2 g by mouth every morning.    Historical Provider, MD  furosemide (LASIX) 20 MG tablet Take 20 mg by mouth daily.    Historical Provider, MD  insulin lispro (HUMALOG) 100 UNIT/ML injection Inject 10 Units into the skin 2 (two) times daily. 10/25/12   Bobby Rumpf York, PA-C  memantine (NAMENDA) 10 MG tablet Take 10 mg by mouth 2 (two) times daily.    Historical Provider, MD  oxyCODONE (OXY IR/ROXICODONE) 5 MG immediate release tablet Take 1-2 tablets (5-10 mg total) by mouth every 6 (six) hours as needed. 10/24/12   Bobby Rumpf York, PA-C  simvastatin (ZOCOR) 40 MG tablet Take 40 mg by mouth every evening.    Historical Provider, MD  traZODone (DESYREL) 100 MG tablet Take 150 mg by mouth at bedtime.     Historical Provider, MD  vitamin E 1000 UNIT capsule Take 1,000 Units by mouth daily.    Historical Provider, MD   BP 116/53  Pulse 105  Temp(Src) 99.7 F (37.6 C) (Rectal)  Resp 37  SpO2 100% Physical Exam  Vitals reviewed. Constitutional: She appears distressed.  HENT:  Head: Normocephalic and atraumatic.  Eyes: Pupils are equal, round, and reactive to light.  Neck: Normal range of motion. No JVD present.  Cardiovascular: Regular rhythm and normal heart sounds.  Tachycardia present.   No murmur heard. Pulmonary/Chest: No stridor. Tachypnea noted. She is in respiratory distress. She has decreased breath sounds. She has rhonchi. She has rales.  bipap  Abdominal: Soft. She exhibits no distension. There is no tenderness.  Musculoskeletal: She exhibits no edema.  Neurological: She is unresponsive.  Skin: She is not diaphoretic.  Psychiatric: She has a normal mood and affect. Her behavior is normal.    ED Course  Procedures (including critical care time) Labs Review Labs Reviewed  CBC - Abnormal; Notable for the following:    WBC 22.1 (*)    RBC 5.60 (*)    MCV 75.9 (*)    MCH 23.2 (*)    RDW 16.2 (*)    All other components within normal limits  PRO B NATRIURETIC PEPTIDE - Abnormal; Notable for the following:    Pro B Natriuretic peptide (BNP) 8764.0 (*)    All other components within normal limits  COMPREHENSIVE METABOLIC PANEL - Abnormal; Notable for the following:    Potassium 3.6 (*)    Glucose, Bld 240 (*)    Calcium 8.2 (*)    Albumin 2.5 (*)    Total Bilirubin <0.2 (*)    GFR calc non Af Amer 87 (*)    All other components within normal limits  CBG MONITORING, ED - Abnormal; Notable for the following:    Glucose-Capillary 230 (*)    All other components within normal limits  CULTURE, BLOOD (ROUTINE X 2)  CULTURE, BLOOD (ROUTINE X 2)  LACTIC ACID, PLASMA  TROPONIN I  I-STAT TROPOININ, ED    Imaging Review Dg Chest Portable 1 View  02/07/2014   CLINICAL  DATA:  80 year old female with acute respiratory distress. Initial encounter. Personal history of left breast cancer.  EXAM: PORTABLE CHEST - 1 VIEW  COMPARISON:  12/22/2012 and earlier.  FINDINGS: Portable AP semi upright view at 2039 hrs. Improved lung volumes. Chronic left lung base hypo ventilation, retrocardiac opacity appears increased. No pneumothorax. Chronic vascular congestion or interstitial opacity is not significantly  changed. Mildly increased veiling opacity at the right base. Stable cardiac size and mediastinal contours. Visualized tracheal air column is within normal limits.  IMPRESSION: Acute on chronic left lung base opacity, suspect in part due to pleural effusion, but left lower lobe aspiration or pneumonia are possible. PA and lateral views would be valuable when possible.   Electronically Signed   By: Lars Pinks M.D.   On: 01/17/2014 21:20     EKG Interpretation None      MDM   Final diagnoses:  Respiratory distress  Sepsis, due to unspecified organism  Healthcare-associated pneumonia  Leukocytosis    Patient is a 80 year old female that presents with acute onset shortness of breath. Patient was last seen normal at 7 PM and when her daughter returned from the store the patient was gasping for breath. EMS was called and on arrival she was 74% and CPAP was placed. Patient's saturations slowly increased to 96% by the time she arrived to the ED. Once the ED the patient was placed on BiPAP. The patient would not respond when asked questions however her eyes were open and she would withdraw to pain. Patient's daughter arrived very shortly after and it was found out that secondary to the patient's severe dementia the patient was a DNR/DNI and we will continue BiPAP. The patient was found to have pleural effusion and possible aspiration pneumonia. We will start antibiotics at this time. Patient will be admitted to medicine for further management.    Renne Musca, MD 02/01/2014 [phone]

## 2014-02-06 NOTE — ED Notes (Addendum)
Per daughter - pt has had diarrhea x 1 week.  Had a doctor's checkup on Monday.  Per daughter, no known exposure to any person with respiratory illness

## 2014-02-06 NOTE — Progress Notes (Signed)
ANTIBIOTIC CONSULT NOTE - INITIAL  Pharmacy Consult for cefepime/vancomycin Indication: HCAP  No Known Allergies  Vital Signs: Temp: 99.7 F (37.6 C) (10/21 2059) Temp Source: Rectal (10/21 2059) BP: 128/50 mmHg (10/21 2115) Pulse Rate: 90 (10/21 2115)  Labs:  Recent Labs  01/20/2014 2038  WBC 22.1*  HGB 13.0  PLT 246   The CrCl is unknown because both a height and weight (above a minimum accepted value) are required for this calculation. No results found for this basename: VANCOTROUGH, VANCOPEAK, VANCORANDOM, GENTTROUGH, GENTPEAK, GENTRANDOM, TOBRATROUGH, TOBRAPEAK, TOBRARND, AMIKACINPEAK, AMIKACINTROU, AMIKACIN,  in the last 72 hours   Microbiology: No results found for this or any previous visit (from the past 720 hour(s)).  Medical History: Past Medical History  Diagnosis Date  . Diabetes mellitus without complication   . Arthritis   . Parkinson disease   . Alzheimer disease   . Dementia   . Aortic stenosis   . Heart murmur   . Cancer     Breast    Assessment: 80 y/o F w/ SOB. CXR shows possible pneumonia. Afebrile, BP 128/50, WBC 22.1.  Patient does not have a current SCr. Patient received Cefepime IV 2g and Vancomycin IV 1000mg  in the ED.  Empiric antibiotics for pneumonia.   Goal of Therapy:  Vancomycin trough 15-20  Plan:  Cefepime IV 1g q8h Vancomycin IV 1000mg  q24h F/u vancomycin trough, renal function F/u vancomycin dose when SCr is obtained.  Carl Best 02/15/2014,9:46 PM  The above note is reviewed, agree with the assessment and plan.  Will order BMET in AM, and pharmacy will f/u current renal function and adjust vancomycin dose if needed.   Maryanna Shape, PharmD, BCPS  Clinical Pharmacist  Pager: [phone]

## 2014-02-06 NOTE — ED Notes (Signed)
Per [facility], pt from home reports sudden onset of SOB, upon EMS arrival pt's respiratory rate was 34 per minute, muscle retractions, with shallow breaths. All lung filed's were wet, placed on CPAP with no improvement, sats steady at 74% with CPAP, BVM initiated with O2 sats increasing to 96%, remains altered which family suggest is pt's normal.

## 2014-02-06 NOTE — ED Notes (Signed)
Pt cleaned up after bowel movement, new gown applied, family at bedside

## 2014-02-07 ENCOUNTER — Encounter (HOSPITAL_COMMUNITY): Payer: Self-pay | Admitting: General Practice

## 2014-02-07 DIAGNOSIS — J9601 Acute respiratory failure with hypoxia: Secondary | ICD-10-CM

## 2014-02-07 DIAGNOSIS — I359 Nonrheumatic aortic valve disorder, unspecified: Secondary | ICD-10-CM

## 2014-02-07 DIAGNOSIS — J189 Pneumonia, unspecified organism: Secondary | ICD-10-CM | POA: Diagnosis present

## 2014-02-07 DIAGNOSIS — E1159 Type 2 diabetes mellitus with other circulatory complications: Secondary | ICD-10-CM

## 2014-02-07 DIAGNOSIS — R0600 Dyspnea, unspecified: Secondary | ICD-10-CM

## 2014-02-07 DIAGNOSIS — F0390 Unspecified dementia without behavioral disturbance: Secondary | ICD-10-CM

## 2014-02-07 HISTORY — DX: Acute respiratory failure with hypoxia: J96.01

## 2014-02-07 LAB — CBG MONITORING, ED
Glucose-Capillary: 127 mg/dL — ABNORMAL HIGH (ref 70–99)
Glucose-Capillary: 160 mg/dL — ABNORMAL HIGH (ref 70–99)

## 2014-02-07 LAB — CREATININE, SERUM
CREATININE: 0.49 mg/dL — AB (ref 0.50–1.10)
GFR, EST NON AFRICAN AMERICAN: 90 mL/min — AB (ref >90)

## 2014-02-07 LAB — CBC
HCT: 35.3 % — ABNORMAL LOW (ref 36.0–46.0)
Hemoglobin: 10.5 g/dL — ABNORMAL LOW (ref 12.0–15.0)
MCH: 22.9 pg — AB (ref 26.0–34.0)
MCHC: 29.7 g/dL — ABNORMAL LOW (ref 30.0–36.0)
MCV: 76.9 fL — ABNORMAL LOW (ref 78.0–100.0)
PLATELETS: 207 10*3/uL (ref 150–400)
RBC: 4.59 MIL/uL (ref 3.87–5.11)
RDW: 16 % — ABNORMAL HIGH (ref 11.5–15.5)
WBC: 12.8 10*3/uL — AB (ref 4.0–10.5)

## 2014-02-07 LAB — GLUCOSE, CAPILLARY
Glucose-Capillary: 223 mg/dL — ABNORMAL HIGH (ref 70–99)
Glucose-Capillary: 266 mg/dL — ABNORMAL HIGH (ref 70–99)
Glucose-Capillary: 341 mg/dL — ABNORMAL HIGH (ref 70–99)

## 2014-02-07 MED ORDER — TRAMADOL HCL 50 MG PO TABS: 50.0000 mg | ORAL_TABLET | Freq: Three times a day (TID) | ORAL | Status: DC | PRN

## 2014-02-07 MED ORDER — OXYCODONE HCL 5 MG PO TABS
5.0000 mg | ORAL_TABLET | Freq: Four times a day (QID) | ORAL | Status: DC | PRN
Start: 2014-02-07 — End: 2014-02-09

## 2014-02-07 MED ORDER — ONDANSETRON HCL 4 MG/2ML IJ SOLN: 4.0000 mg | Freq: Four times a day (QID) | INTRAMUSCULAR | Status: DC | PRN

## 2014-02-07 MED ORDER — ASPIRIN EC 325 MG PO TBEC
325.0000 mg | DELAYED_RELEASE_TABLET | Freq: Every day | ORAL | Status: DC
  Administered 2014-02-07 – 2014-02-08 (×2): 325 mg via ORAL
  Filled 2014-02-07 (×3): qty 1

## 2014-02-07 MED ORDER — VANCOMYCIN HCL IN DEXTROSE 1-5 GM/200ML-% IV SOLN
1000.0000 mg | INTRAVENOUS | Status: DC
  Filled 2014-02-07: qty 200

## 2014-02-07 MED ORDER — FUROSEMIDE 10 MG/ML IJ SOLN
20.0000 mg | Freq: Every day | INTRAMUSCULAR | Status: DC
  Administered 2014-02-07: 20 mg via INTRAVENOUS
  Filled 2014-02-07: qty 2

## 2014-02-07 MED ORDER — ACETAMINOPHEN 325 MG PO TABS: 650.0000 mg | ORAL_TABLET | Freq: Four times a day (QID) | ORAL | Status: DC | PRN

## 2014-02-07 MED ORDER — CITALOPRAM HYDROBROMIDE 10 MG PO TABS
15.0000 mg | ORAL_TABLET | Freq: Every day | ORAL | Status: DC
  Administered 2014-02-07 – 2014-02-08 (×2): 15 mg via ORAL
  Filled 2014-02-07 (×4): qty 2

## 2014-02-07 MED ORDER — INFLUENZA VAC SPLIT QUAD 0.5 ML IM SUSY
0.5000 mL | PREFILLED_SYRINGE | INTRAMUSCULAR | Status: DC
  Filled 2014-02-07: qty 0.5

## 2014-02-07 MED ORDER — PIPERACILLIN-TAZOBACTAM 3.375 G IVPB
3.3750 g | Freq: Three times a day (TID) | INTRAVENOUS | Status: DC
  Administered 2014-02-07 – 2014-02-08 (×6): 3.375 g via INTRAVENOUS
  Filled 2014-02-07 (×9): qty 50

## 2014-02-07 MED ORDER — ENOXAPARIN SODIUM 40 MG/0.4ML ~~LOC~~ SOLN
40.0000 mg | Freq: Every day | SUBCUTANEOUS | Status: DC
  Administered 2014-02-07 – 2014-02-08 (×2): 40 mg via SUBCUTANEOUS
  Filled 2014-02-07 (×3): qty 0.4

## 2014-02-07 MED ORDER — PANTOPRAZOLE SODIUM 40 MG PO TBEC
40.0000 mg | DELAYED_RELEASE_TABLET | Freq: Every day | ORAL | Status: DC
  Administered 2014-02-07 – 2014-02-08 (×2): 40 mg via ORAL
  Filled 2014-02-07 (×2): qty 1

## 2014-02-07 MED ORDER — INSULIN ASPART 100 UNIT/ML ~~LOC~~ SOLN
0.0000 [IU] | Freq: Three times a day (TID) | SUBCUTANEOUS | Status: DC
  Administered 2014-02-07: 3 [IU] via SUBCUTANEOUS
  Administered 2014-02-07 – 2014-02-08 (×2): 5 [IU] via SUBCUTANEOUS
  Administered 2014-02-08: 7 [IU] via SUBCUTANEOUS
  Administered 2014-02-08: 3 [IU] via SUBCUTANEOUS

## 2014-02-07 MED ORDER — OMEGA-3-ACID ETHYL ESTERS 1 G PO CAPS
2.0000 g | ORAL_CAPSULE | Freq: Every day | ORAL | Status: DC
  Administered 2014-02-07: 2 g via ORAL
  Filled 2014-02-07 (×3): qty 2

## 2014-02-07 MED ORDER — FUROSEMIDE 10 MG/ML IJ SOLN
40.0000 mg | Freq: Every day | INTRAMUSCULAR | Status: DC
  Filled 2014-02-07: qty 4

## 2014-02-07 MED ORDER — ACETAMINOPHEN 650 MG RE SUPP: 650.0000 mg | Freq: Four times a day (QID) | RECTAL | Status: DC | PRN

## 2014-02-07 MED ORDER — DIVALPROEX SODIUM 500 MG PO DR TAB
500.0000 mg | DELAYED_RELEASE_TABLET | Freq: Every day | ORAL | Status: DC
  Administered 2014-02-07 – 2014-02-08 (×2): 500 mg via ORAL
  Filled 2014-02-07 (×3): qty 1

## 2014-02-07 MED ORDER — INSULIN GLARGINE 100 UNIT/ML ~~LOC~~ SOLN
15.0000 [IU] | Freq: Every day | SUBCUTANEOUS | Status: DC
  Administered 2014-02-07: 15 [IU] via SUBCUTANEOUS
  Filled 2014-02-07 (×2): qty 0.15

## 2014-02-07 MED ORDER — LORAZEPAM 0.5 MG PO TABS: 0.5000 mg | ORAL_TABLET | Freq: Four times a day (QID) | ORAL | Status: DC | PRN

## 2014-02-07 MED ORDER — ONDANSETRON HCL 4 MG PO TABS: 4.0000 mg | ORAL_TABLET | Freq: Four times a day (QID) | ORAL | Status: DC | PRN

## 2014-02-07 MED ORDER — ALBUTEROL SULFATE (2.5 MG/3ML) 0.083% IN NEBU: 2.5000 mg | INHALATION_SOLUTION | RESPIRATORY_TRACT | Status: DC | PRN

## 2014-02-07 MED ORDER — MEMANTINE HCL 10 MG PO TABS
10.0000 mg | ORAL_TABLET | Freq: Two times a day (BID) | ORAL | Status: DC
  Administered 2014-02-07 – 2014-02-08 (×4): 10 mg via ORAL
  Filled 2014-02-07 (×6): qty 1

## 2014-02-07 MED ORDER — DONEPEZIL HCL 10 MG PO TABS
10.0000 mg | ORAL_TABLET | Freq: Every day | ORAL | Status: DC
  Administered 2014-02-07 – 2014-02-08 (×2): 10 mg via ORAL
  Filled 2014-02-07 (×3): qty 1

## 2014-02-07 MED ORDER — SODIUM CHLORIDE 0.9 % IJ SOLN
3.0000 mL | Freq: Two times a day (BID) | INTRAMUSCULAR | Status: DC
  Administered 2014-02-07 – 2014-02-08 (×2): 3 mL via INTRAVENOUS

## 2014-02-07 MED ORDER — VITAMIN E 45 MG (100 UNIT) PO CAPS
1000.0000 [IU] | ORAL_CAPSULE | Freq: Every day | ORAL | Status: DC
  Administered 2014-02-07: 900 [IU] via ORAL
  Filled 2014-02-07 (×3): qty 2

## 2014-02-07 MED ORDER — SIMVASTATIN 40 MG PO TABS
40.0000 mg | ORAL_TABLET | Freq: Every evening | ORAL | Status: DC
  Administered 2014-02-07 – 2014-02-08 (×2): 40 mg via ORAL
  Filled 2014-02-07 (×3): qty 1

## 2014-02-07 NOTE — Progress Notes (Signed)
Removed patient from BiPAP and placed on 100% NRB at this time. All vitals within normal limits and patient in no apparent distress. Will begin to wean O2 if possible.

## 2014-02-07 NOTE — Progress Notes (Signed)
  Echocardiogram 2D Echocardiogram has been performed.  Emily Gibbs 02/07/2014, 2:51 PM

## 2014-02-07 NOTE — Consult Note (Signed)
Patient XB:[id number] Lammert      DOB: 1932/01/25      AOZ:[id number]     Consult Note from the Palliative Medicine Team at [facility] Requested by: Dr. Hal Hope     PCP: Mathews Argyle, MD Reason for Consultation: [place], hospice options    Phone Number:[phone]  Assessment of patients Current state: I met today with Emily Gibbs's daughter, Emily Gibbs whom she lives with the past ~4 years and other children and grandchildren. Emily Gibbs says she has been having diarrhea for 2 weeks that tested negative for C diff with PCP. They tell me that they puree her food but not consistently and only if she seems to be having a bad day. They have not used any thickener for liquids previously. Emily Gibbs says she does seem to cough more with water. They tell me she will drink ensure and that her intake seems to "be fine" but varies from day to day. They deny any significant change in weight but they say she has lost weight gradually over the years. She is incontinent of urine and stool. She was previously on hospice care but was discharged 2-3 weeks ago from this service. DNR is confirmed and comfort is a priority. However they do want to treat the treatable and reversible but understand that her overall condition is not reversible. She has care in the home with Emily Gibbs and Lydia Guiles daughter living there and someone is with her 24/7 and they have been able to manage her needs. They do say that it has been extremely difficult on her and them to try and get her to her doctor appointments and were very disappointed when hospice was ended. They say that when she saw the doctor and was discharged from hospice that it was an unusually good day for her that is not very common - she was talking and singing and happy but they say that she rarely talks over the past 2 years. I would recommend to get hospice care initiated back into the home at discharge and family very much wants this to happen. They wish to continue gentle  support with antibiotics, lasix, medications as needed, vaccinations but do not want invasive procedures/testing or aggressive care.   Granddaughter also tells me that her son who lives in the home had some sort of parasite with diarrhea he was treated for ~ 3 weeks ago. Will order stool for parasitic evaluation.    Goals of Care: 1.  Code Status: DNR   2. Scope of Treatment: Continue supportive care. Family confirms DNR but do not want invasive procedures/testing or aggressive care.    4. Disposition: Home with hospice when stable.    3. Symptom Management:   1. Anxiety/Agitation: Emily Gibbs says that she takes lorazepam at home - she is to call back with dosage. 2. Pain: Emily Gibbs to call with dosage for tramadol she takes at home. Oxycodone 5-10 mg every 6 hours prn.  3. Bowel Regimen: Diarrhea - sample to be sent r/o C diff. Also send for parasitic evaluation.  4. Fever: Acetaminophen prn.  5. Nausea/Vomiting: Ondansetron prn.   4. Psychosocial: Emotional support provided to patient and family.    Patient Documents Completed or Given: Document Given Completed  Advanced Directives Pkt    MOST    DNR    Gone from My [facility]ight    Hard Choices yes     Brief HPI: 80 yo female with advanced dementia. Admitted with shortness of breath r/t likely aspiration pneumonia  with possible CHF factor. Continue antibiotics and IV Lasix as needed. Evaluating and testing diarrhea which is more stable now. PMH significant for diabetes mellitus, arthritis, Parkinson's disease, Alzheimer's disease, aortic stenosis, heart murmur, breast cancer.   ROS: Unable to assess - nonverbal.     PMH:  Past Medical History  Diagnosis Date  . Diabetes mellitus without complication   . Arthritis   . Parkinson disease   . Alzheimer disease   . Dementia   . Aortic stenosis   . Heart murmur   . Cancer     Breast   . Complication of anesthesia     difficulty waking  . Acute respiratory failure with hypoxia  02/07/2014     PSH: Past Surgical History  Procedure Laterality Date  . Abdominal hysterectomy    . Mastectomy      Left  . Femoral artery stent    . Amputation Right 10/23/2012    Procedure: AMPUTATION ABOVE KNEE;  Surgeon: Rosetta Posner, MD;  Location: [facility];  Service: Vascular;  Laterality: Right;  . Eye surgery     I have reviewed the [place] and SH and  If appropriate update it with new information. No Known Allergies Scheduled Meds: . aspirin EC  325 mg Oral Daily  . ceFEPime (MAXIPIME) IV  1 g Intravenous 3 times per day  . citalopram  15 mg Oral QHS  . divalproex  500 mg Oral QHS  . donepezil  10 mg Oral QHS  . enoxaparin (LOVENOX) injection  40 mg Subcutaneous Daily  . [START ON 02/08/2014] furosemide  40 mg Intravenous Daily  . insulin aspart  0-9 Units Subcutaneous TID WC  . insulin glargine  15 Units Subcutaneous Daily  . memantine  10 mg Oral BID  . omega-3 acid ethyl esters  2 g Oral Daily  . pantoprazole  40 mg Oral Daily  . piperacillin-tazobactam (ZOSYN)  IV  3.375 g Intravenous 3 times per day  . simvastatin  40 mg Oral QPM  . sodium chloride  3 mL Intravenous Q12H  . vancomycin  1,000 mg Intravenous Q24H  . vitamin E  1,000 Units Oral Daily   Continuous Infusions:  PRN Meds:.acetaminophen, acetaminophen, albuterol, ondansetron (ZOFRAN) IV, ondansetron, oxyCODONE    BP 141/47  Pulse 77  Temp(Src) 99.1 F (37.3 C) (Axillary)  Resp 20  Ht 5' 1"  (1.549 m)  Wt 61 kg (134 lb 7.7 oz)  BMI 25.42 kg/m2  SpO2 97%   PPS: 30%  No intake or output data in the 24 hours ending 02/07/14 1421 LBM: 10/22                       Physical Exam:  General: NAD, pleasant HEENT: [place]/AT, no JVD, moist mucous membranes Chest:  No labored breathing, symmetric CVS: RRR, S1 S2, murmur Abdomen: Soft, NT, ND, +BS Ext: h/o Rt BKA, no edema, warm to touch Neuro: Awake, alert, nonverbal, unable to follow commands, + tracking  Labs: CBC    Component Value Date/Time   WBC  12.8* 02/07/2014 0634   RBC 4.59 02/07/2014 0634   HGB 10.5* 02/07/2014 0634   HCT 35.3* 02/07/2014 0634   PLT 207 02/07/2014 0634   MCV 76.9* 02/07/2014 0634   MCH 22.9* 02/07/2014 0634   MCHC 29.7* 02/07/2014 0634   RDW 16.0* 02/07/2014 0634   LYMPHSABS 2.8 10/19/2012 1243   MONOABS 1.2* 10/19/2012 1243   EOSABS 0.2 10/19/2012 1243   BASOSABS 0.0 10/19/2012  1243    BMET    Component Value Date/Time   NA 138 02/14/2014 2213   K 3.6* 01/19/2014 2213   CL 96 01/28/2014 2213   CO2 31 02/13/2014 2213   GLUCOSE 240* 02/08/2014 2213   BUN 9 01/25/2014 2213   CREATININE 0.49* 02/07/2014 0634   CALCIUM 8.2* 01/21/2014 2213   GFRNONAA 90* 02/07/2014 0634   GFRAA >90 02/07/2014 0634    CMP     Component Value Date/Time   NA 138 02/16/2014 2213   K 3.6* 01/25/2014 2213   CL 96 02/16/2014 2213   CO2 31 01/21/2014 2213   GLUCOSE 240* 01/25/2014 2213   BUN 9 02/12/2014 2213   CREATININE 0.49* 02/07/2014 0634   CALCIUM 8.2* 01/18/2014 2213   PROT 6.2 02/05/2014 2213   ALBUMIN 2.5* 01/29/2014 2213   AST 12 01/26/2014 2213   ALT 7 02/07/2014 2213   ALKPHOS 79 02/08/2014 2213   BILITOT <0.2* 01/21/2014 2213   GFRNONAA 90* 02/07/2014 0634   GFRAA >90 02/07/2014 0634     Time In Time Out Total Time Spent with Patient Total Overall Time  1430 1540 34mn 764m    Greater than 50%  of this time was spent counseling and coordinating care related to the above assessment and plan.  AlVinie SillNP Palliative Medicine Team Pager # [phone]-F 8a-5p) Team Phone # [phone]/Weekends)

## 2014-02-07 NOTE — ED Notes (Signed)
Spoke with Dr Candiss Norse and informed that pt was placed on 6L O2 and tolerating well with sats at 100%. States that he will request a telemetry bed for pt.

## 2014-02-07 NOTE — Progress Notes (Signed)
RT note: Pt. seen in no apparent distress, remains on NRB mask, RT to monitor.

## 2014-02-07 NOTE — ED Provider Notes (Signed)
Medical screening examination/treatment/procedure(s) were conducted as a shared visit with non-physician practitioner(s) or resident and myself. I personally evaluated the patient during the encounter and agree with the findings.  I have personally reviewed any xrays and/ or EKG's with the provider and I agree with interpretation.  Patient dementia history. Aortic Stenosis, diabetes presents with acute respiratory distress that started within 2 hours prior to arrival. Family now here for details. Patient had acute onset symptoms no history of similar. No known heart failure however patient does have aortic stenosis. Patient is on Lasix for leg swelling. No recent infection known. On exam patient tachypnea, no verbal response, staring straight ahead, general weakness, respiratory distress, crackles bilateral, tachycardic. Bedside ultrasound showed pleural effusion on the right, concern for acute pulmonary edema and pneumonia.  Long discussion with family to discuss intubation, pt DNR. Bipap used in ED.  EMERGENCY DEPARTMENT [place] CARDIAC EXAM  "Study: Limited Ultrasound of the heart and pericardium"  INDICATIONS:Tachycardia and Dyspnea  Multiple views of the heart and pericardium were obtained in real-time with a multi-frequency probe.  PERFORMED TF:TDDUKG  IMAGES ARCHIVED?: Yes  FINDINGS: No pericardial effusion, Decreased contractility, IVC dilated and Tamponade physiology absent  LIMITATIONS: Emergent procedure  VIEWS USED: Parasternal long axis, Parasternal short axis, Apical 4 chamber and Inferior Vena Cava  INTERPRETATION: Cardiac activity present, Pericardial effusioin absent, Cardiac tamponade absent, Probable elevated CVP, Decreased contractility and Increased contractility  Emergency Ultrasound: Limited Thoracic  Performed and interpreted by Dr Reather Converse  Longitudinal view of anterior left and right lung fields in real-time with linear probe.  Indication: sob  Findings: pleural effusion right  greater than left  Images electronically archived.  CRITICAL CARE Performed by: Mariea Clonts  comparison the  Total critical care time: 40 min  Critical care time was exclusive of separately billable procedures and treating other patients.  Critical care was necessary to treat or prevent imminent or life-threatening deterioration.  Critical care was time spent personally by me on the following activities: development of treatment plan with patient and/or surrogate as well as nursing, discussions with consultants, evaluation of patient's response to treatment, examination of patient, obtaining history from patient or surrogate, ordering and performing treatments and interventions, ordering and review of laboratory studies, ordering and review of radiographic studies, pulse oximetry and re-evaluation of patient's condition.  Dg Chest Portable 1 View  02/04/2014 CLINICAL DATA: 80 year old female with acute respiratory distress. Initial encounter. Personal history of left breast cancer. EXAM: PORTABLE CHEST - 1 VIEW COMPARISON: 12/22/2012 and earlier. FINDINGS: Portable AP semi upright view at 2039 hrs. Improved lung volumes. Chronic left lung base hypo ventilation, retrocardiac opacity appears increased. No pneumothorax. Chronic vascular congestion or interstitial opacity is not significantly changed. Mildly increased veiling opacity at the right base. Stable cardiac size and mediastinal contours. Visualized tracheal air column is within normal limits. IMPRESSION: Acute on chronic left lung base opacity, suspect in part due to pleural effusion, but left lower lobe aspiration or pneumonia are possible. PA and lateral views would be valuable when possible. Electronically Signed By: Lars Pinks M.D. On: 02/14/2014 21:20  Acute respiratory failure, aspiration pneumonia, abnormal EKG, pleural effusion   Mariea Clonts, MD 02/07/14 2542

## 2014-02-07 NOTE — Progress Notes (Signed)
ANTIBIOTIC CONSULT NOTE - INITIAL  Pharmacy Consult for vanc and zosyn  Indication: aspiration PNA  No Known Allergies  Patient Measurements:   Adjusted Body Weight:   Vital Signs: Temp: 99.7 F (37.6 C) (10/21 2059) Temp Source: Rectal (10/21 2059) BP: 127/103 mmHg (10/22 0530) Pulse Rate: 81 (10/22 0530) Intake/Output from previous day:   Intake/Output from this shift:    Labs:  Recent Labs  02/02/2014 2038 01/18/2014 2213  WBC 22.1*  --   HGB 13.0  --   PLT 246  --   CREATININE  --  0.54   The CrCl is unknown because both a height and weight (above a minimum accepted value) are required for this calculation. No results found for this basename: VANCOTROUGH, VANCOPEAK, VANCORANDOM, GENTTROUGH, GENTPEAK, GENTRANDOM, TOBRATROUGH, TOBRAPEAK, TOBRARND, AMIKACINPEAK, AMIKACINTROU, AMIKACIN,  in the last 72 hours   Microbiology: No results found for this or any previous visit (from the past 720 hour(s)).  Medical History: Past Medical History  Diagnosis Date  . Diabetes mellitus without complication   . Arthritis   . Parkinson disease   . Alzheimer disease   . Dementia   . Aortic stenosis   . Heart murmur   . Cancer     Breast     Medications:   (Not in a hospital admission) Assessment: 80 yo female with advanced dementia aortic stenosis and PVD with right AKA and DM  Found in acute resp distress. Possible aspiration. vanc and zosyn for broad cvg  Goal of Therapy:  Vancomycin trough level 15-20 mcg/ml  Plan:  Vancomycin 1 gm q24h  Zosyn 3.375 mg q8h   Curlene Dolphin 02/07/2014,6:36 AM

## 2014-02-07 NOTE — Progress Notes (Signed)
Patient Demographics  Emily Gibbs, is a 80 y.o. female, DOB - 07/28/1931, FIE:[id number]  Admit date - 02/15/2014   Admitting Physician Thurnell Lose, MD  Outpatient Primary MD for the patient is Mathews Argyle, MD  LOS - 1   Chief Complaint  Patient presents with  . Respiratory Distress        Subjective:   Emily Gibbs today In bed appears comfortable, unable to answer questions reliably as advanced dementia.   Assessment & Plan    1. Acute respiratory failure with hypoxia - most likely secondary to aspiration pneumonia less likely CHF  . Continue present empiric antibiotics Aspiration precautions with feeding assistance, will have speech evaluate as aspiration is a possibility. We'll check baseline echogram. Has received IV Lasix for today already. Clinically much improved, currently no oxygen needed. Monitor closely.   2.Diarrhea - check stool for C. difficile. Currently no further episodes of diarrhea, monitor.    3. Diabetes mellitus type 2 - on insulin sliding scale We'll add low-dose Lantus for better control.  CBG (last 3)   Recent Labs  02/07/14 0650 02/07/14 0807 02/07/14 1231  GLUCAP 127* 160* 223*     4.Advanced dementia - continue home medications. Discussed with multiple family members bedside general medical treatment and minimal lab draws acceptable, if declines full comfort care.    5. Peripheral vascular disease status post right AKA, No acute issues.Continue aspirin for secondary prevention.    6.Systolic murmur - check Echo likely age related AS.     Code Status: DO NOT RESUSCITATE  Family Communication: Multiple family members bedside  Disposition Plan: TBD   Procedures     Consults speech   Medications  Scheduled  Meds: . aspirin EC  325 mg Oral Daily  . ceFEPime (MAXIPIME) IV  1 g Intravenous 3 times per day  . citalopram  15 mg Oral QHS  . divalproex  500 mg Oral QHS  . donepezil  10 mg Oral QHS  . enoxaparin (LOVENOX) injection  40 mg Subcutaneous Daily  . furosemide  20 mg Intravenous Daily  . insulin aspart  0-9 Units Subcutaneous TID WC  . memantine  10 mg Oral BID  . omega-3 acid ethyl esters  2 g Oral Daily  . pantoprazole  40 mg Oral Daily  . piperacillin-tazobactam (ZOSYN)  IV  3.375 g Intravenous 3 times per day  . simvastatin  40 mg Oral QPM  . sodium chloride  3 mL Intravenous Q12H  . vancomycin  1,000 mg Intravenous Q24H  . vancomycin  1,000 mg Intravenous Q24H  . vitamin E  1,000 Units Oral Daily   Continuous Infusions:  PRN Meds:.acetaminophen, acetaminophen, albuterol, ondansetron (ZOFRAN) IV, ondansetron, oxyCODONE  DVT Prophylaxis  Lovenox   Lab Results  Component Value Date   PLT 207 02/07/2014    Antibiotics   Anti-infectives   Start     Dose/Rate Route Frequency Ordered Stop   02/07/14 2200  vancomycin (VANCOCIN) IVPB 1000 mg/200 mL premix     1,000 mg 200 mL/hr over 60 Minutes Intravenous Every 24 hours 01/29/2014 2211     02/07/14 2200  vancomycin (VANCOCIN) IVPB 1000 mg/200 mL premix     1,000 mg 200 mL/hr over 60 Minutes Intravenous  Every 24 hours 02/07/14 0639     02/07/14 0645  piperacillin-tazobactam (ZOSYN) IVPB 3.375 g     3.375 g 12.5 mL/hr over 240 Minutes Intravenous 3 times per day 02/07/14 0639     02/07/14 0600  ceFEPIme (MAXIPIME) 1 g in dextrose 5 % 50 mL IVPB     1 g 100 mL/hr over 30 Minutes Intravenous 3 times per day 01/18/2014 2211     02/03/2014 2145  ceFEPIme (MAXIPIME) 2 g in dextrose 5 % 50 mL IVPB     2 g 100 mL/hr over 30 Minutes Intravenous  Once 01/30/2014 2135 02/08/2014 2312   02/14/2014 2145  vancomycin (VANCOCIN) IVPB 1000 mg/200 mL premix     1,000 mg 200 mL/hr over 60 Minutes Intravenous  Once 01/30/2014 2135 02/07/14 0032           Objective:   Filed Vitals:   02/07/14 1030 02/07/14 1100 02/07/14 1130 02/07/14 1236  BP: 125/43 120/42 119/66 141/47  Pulse: 91 83 83 77  Temp:    99.1 F (37.3 C)  TempSrc:    Axillary  Resp: 22 22 22 20   Height:   5\' 1"  (1.549 m)   Weight:   61 kg (134 lb 7.7 oz)   SpO2: 100% 100% 98% 97%    Wt Readings from Last 3 Encounters:  02/07/14 61 kg (134 lb 7.7 oz)  11/21/12 69.4 kg (153 lb)  10/19/12 67.903 kg (149 lb 11.2 oz)    No intake or output data in the 24 hours ending 02/07/14 1305   Physical Exam  Awake , pleasantly confused , No new F.N deficits, Normal affect [place].AT,PERRAL Supple Neck,No JVD, No cervical lymphadenopathy appriciated.  Symmetrical Chest wall movement, Good air movement bilaterally, CTAB RRR,No Gallops,Rubs , +ve systolic Murmurs, No Parasternal Heave +ve B.Sounds, Abd Soft, No tenderness, No organomegaly appriciated, No rebound - guarding or rigidity. No Cyanosis, Clubbing or edema, No new Rash or bruise      Data Review   Micro Results No results found for this or any previous visit (from the past 240 hour(s)).  Radiology Reports Dg Chest Portable 1 View  02/08/2014   CLINICAL DATA:  80 year old female with acute respiratory distress. Initial encounter. Personal history of left breast cancer.  EXAM: PORTABLE CHEST - 1 VIEW  COMPARISON:  12/22/2012 and earlier.  FINDINGS: Portable AP semi upright view at 2039 hrs. Improved lung volumes. Chronic left lung base hypo ventilation, retrocardiac opacity appears increased. No pneumothorax. Chronic vascular congestion or interstitial opacity is not significantly changed. Mildly increased veiling opacity at the right base. Stable cardiac size and mediastinal contours. Visualized tracheal air column is within normal limits.  IMPRESSION: Acute on chronic left lung base opacity, suspect in part due to pleural effusion, but left lower lobe aspiration or pneumonia are possible. PA and lateral views  would be valuable when possible.   Electronically Signed   By: Lars Pinks M.D.   On: 02/03/2014 21:20     CBC  Recent Labs Lab 01/26/2014 2038 02/07/14 0634  WBC 22.1* 12.8*  HGB 13.0 10.5*  HCT 42.5 35.3*  PLT 246 207  MCV 75.9* 76.9*  MCH 23.2* 22.9*  MCHC 30.6 29.7*  RDW 16.2* 16.0*    Chemistries   Recent Labs Lab 02/08/2014 2213 02/07/14 0634  NA 138  --   K 3.6*  --   CL 96  --   CO2 31  --   GLUCOSE 240*  --   BUN  9  --   CREATININE 0.54 0.49*  CALCIUM 8.2*  --   AST 12  --   ALT 7  --   ALKPHOS 79  --   BILITOT <0.2*  --    ------------------------------------------------------------------------------------------------------------------ estimated creatinine clearance is 47 ml/min (by C-G formula based on Cr of 0.49). ------------------------------------------------------------------------------------------------------------------ No results found for this basename: HGBA1C,  in the last 72 hours ------------------------------------------------------------------------------------------------------------------ No results found for this basename: CHOL, HDL, LDLCALC, TRIG, CHOLHDL, LDLDIRECT,  in the last 72 hours ------------------------------------------------------------------------------------------------------------------ No results found for this basename: TSH, T4TOTAL, FREET3, T3FREE, THYROIDAB,  in the last 72 hours ------------------------------------------------------------------------------------------------------------------ No results found for this basename: VITAMINB12, FOLATE, FERRITIN, TIBC, IRON, RETICCTPCT,  in the last 72 hours  Coagulation profile No results found for this basename: INR, PROTIME,  in the last 168 hours  No results found for this basename: DDIMER,  in the last 72 hours  Cardiac Enzymes  Recent Labs Lab 01/27/2014 2213  TROPONINI <0.30    ------------------------------------------------------------------------------------------------------------------ No components found with this basename: POCBNP,      Time Spent in minutes  35   SINGH,PRASHANT K M.D on 02/07/2014 at 1:05 PM  Between 7am to 7pm - Pager - [phone]  After 7pm go to [email or web address] - password [email or web address]  And look for the night coverage person covering for me after hours  [facility] Office  [phone]

## 2014-02-07 NOTE — Progress Notes (Signed)
UR complete Edwyna Shell, RN, BSN, Case Manager 02/07/2014 11:55 AM

## 2014-02-07 NOTE — ED Notes (Signed)
CALLED BED CONTROL TO CHECK ON DELAY IN GETTING PT A TELE BED. THEY ADVISE THEY WERE NOT AWARE PT HAD BEEN DOWN GRADED TO TELE THIS MORNING AT [phone]. I DID SPEAK TO FLOW MANAGER AT 9 AM AND THEY WERE AWARE OF THE CHANGE IN BED STATUS

## 2014-02-08 LAB — CBC
HCT: 34.1 % — ABNORMAL LOW (ref 36.0–46.0)
HEMOGLOBIN: 10.1 g/dL — AB (ref 12.0–15.0)
MCH: 22.5 pg — ABNORMAL LOW (ref 26.0–34.0)
MCHC: 29.6 g/dL — AB (ref 30.0–36.0)
MCV: 76.1 fL — ABNORMAL LOW (ref 78.0–100.0)
Platelets: 181 10*3/uL (ref 150–400)
RBC: 4.48 MIL/uL (ref 3.87–5.11)
RDW: 16.2 % — ABNORMAL HIGH (ref 11.5–15.5)
WBC: 7.4 10*3/uL (ref 4.0–10.5)

## 2014-02-08 LAB — BASIC METABOLIC PANEL
ANION GAP: 6 (ref 5–15)
BUN: 7 mg/dL (ref 6–23)
CHLORIDE: 101 meq/L (ref 96–112)
CO2: 37 mEq/L — ABNORMAL HIGH (ref 19–32)
Calcium: 8.6 mg/dL (ref 8.4–10.5)
Creatinine, Ser: 0.48 mg/dL — ABNORMAL LOW (ref 0.50–1.10)
GFR calc non Af Amer: >90 mL/min (ref >90)
Glucose, Bld: 238 mg/dL — ABNORMAL HIGH (ref 70–99)
Potassium: 4.2 mEq/L (ref 3.7–5.3)
Sodium: 144 mEq/L (ref 137–147)

## 2014-02-08 LAB — GLUCOSE, CAPILLARY
GLUCOSE-CAPILLARY: 251 mg/dL — AB (ref 70–99)
GLUCOSE-CAPILLARY: 309 mg/dL — AB (ref 70–99)
Glucose-Capillary: 204 mg/dL — ABNORMAL HIGH (ref 70–99)
Glucose-Capillary: 320 mg/dL — ABNORMAL HIGH (ref 70–99)

## 2014-02-08 MED ORDER — INSULIN ASPART 100 UNIT/ML ~~LOC~~ SOLN: 0.0000 [IU] | Freq: Three times a day (TID) | SUBCUTANEOUS | Status: DC

## 2014-02-08 MED ORDER — CETYLPYRIDINIUM CHLORIDE 0.05 % MT LIQD
7.0000 mL | Freq: Two times a day (BID) | OROMUCOSAL | Status: DC
  Administered 2014-02-08: 7 mL via OROMUCOSAL

## 2014-02-08 MED ORDER — INSULIN ASPART 100 UNIT/ML ~~LOC~~ SOLN
0.0000 [IU] | Freq: Every day | SUBCUTANEOUS | Status: DC
  Administered 2014-02-08: 4 [IU] via SUBCUTANEOUS

## 2014-02-08 MED ORDER — INSULIN GLARGINE 100 UNIT/ML ~~LOC~~ SOLN
18.0000 [IU] | Freq: Two times a day (BID) | SUBCUTANEOUS | Status: DC
  Administered 2014-02-08: 18 [IU] via SUBCUTANEOUS
  Filled 2014-02-08 (×3): qty 0.18

## 2014-02-08 MED ORDER — INSULIN GLARGINE 100 UNIT/ML ~~LOC~~ SOLN
25.0000 [IU] | Freq: Every day | SUBCUTANEOUS | Status: DC
Start: 2014-02-08 — End: 2014-02-08
  Administered 2014-02-08: 25 [IU] via SUBCUTANEOUS
  Filled 2014-02-08: qty 0.25

## 2014-02-08 MED ORDER — INSULIN ASPART 100 UNIT/ML ~~LOC~~ SOLN
4.0000 [IU] | Freq: Three times a day (TID) | SUBCUTANEOUS | Status: DC
  Administered 2014-02-08 (×2): 4 [IU] via SUBCUTANEOUS

## 2014-02-08 MED ORDER — FUROSEMIDE 40 MG PO TABS
40.0000 mg | ORAL_TABLET | Freq: Every day | ORAL | Status: DC
  Administered 2014-02-08: 40 mg via ORAL
  Filled 2014-02-08 (×2): qty 1

## 2014-02-08 NOTE — Progress Notes (Addendum)
Patient Demographics  Emily Gibbs, is a 80 y.o. female, DOB - 12-May-1931, LTJ:[id number]  Admit date - 02/01/2014   Admitting Physician Thurnell Lose, MD  Outpatient Primary MD for the patient is Emily Argyle, MD  LOS - 2   Chief Complaint  Patient presents with  . Respiratory Distress        Subjective:   Emily Gibbs today In bed appears comfortable, unable to answer questions reliably due to her advanced dementia.   Assessment & Plan    1. Acute respiratory failure with hypoxia - secondary to aspiration pneumonia with sepsis and bacteremia  . Continue present empiric antibiotics Aspiration precautions with feeding assistance, will have speech evaluate as aspiration is a possibility. Noted echogram. Clinically much improved, currently no oxygen needed. Monitor closely .   2.Diarrhea - check stool for C. difficile. Currently no further episodes of diarrhea, monitor.    3. Diabetes mellitus type 2 - on insulin sliding scale increase Lantus and add pre-meal NovoLog for better control.  CBG (last 3)   Recent Labs  02/07/14 1717 02/07/14 2256 02/08/14 0759  GLUCAP 266* 341* 204*     4.Advanced dementia - continue home medications. Discussed with multiple family members bedside general medical treatment and minimal lab draws acceptable, if declines full comfort care.    5. Peripheral vascular disease status post right AKA, No acute issues.Continue aspirin for secondary prevention.    6.Systolic murmur - check Echo likely age related AS.    7. Chronic grade 1 diastolic CHF EF 07-62%. Continue home dose diuretic. Compensated.     Code Status: DO NOT RESUSCITATE, overall prognosis appears poor will involve palliative care also  Family Communication:  Multiple family members bedside  Disposition Plan: TBD   Procedures     Consults speech, palliative care   Medications  Scheduled Meds: . aspirin EC  325 mg Oral Daily  . ceFEPime (MAXIPIME) IV  1 g Intravenous 3 times per day  . citalopram  15 mg Oral QHS  . divalproex  500 mg Oral QHS  . donepezil  10 mg Oral QHS  . enoxaparin (LOVENOX) injection  40 mg Subcutaneous Daily  . furosemide  40 mg Oral Daily  . Influenza vac split quadrivalent PF  0.5 mL Intramuscular Tomorrow-1000  . insulin aspart  0-9 Units Subcutaneous TID WC  . insulin aspart  4 Units Subcutaneous TID WC  . insulin glargine  25 Units Subcutaneous Daily  . memantine  10 mg Oral BID  . omega-3 acid ethyl esters  2 g Oral Daily  . pantoprazole  40 mg Oral Daily  . piperacillin-tazobactam (ZOSYN)  IV  3.375 g Intravenous 3 times per day  . simvastatin  40 mg Oral QPM  . sodium chloride  3 mL Intravenous Q12H  . vancomycin  1,000 mg Intravenous Q24H  . vitamin E  1,000 Units Oral Daily   Continuous Infusions:  PRN Meds:.acetaminophen, acetaminophen, albuterol, LORazepam, ondansetron (ZOFRAN) IV, ondansetron, oxyCODONE, traMADol  DVT Prophylaxis  Lovenox   Lab Results  Component Value Date   PLT 181 02/08/2014    Antibiotics   Anti-infectives   Start     Dose/Rate Route Frequency Ordered Stop   02/07/14 2200  vancomycin (VANCOCIN) IVPB  1000 mg/200 mL premix     1,000 mg 200 mL/hr over 60 Minutes Intravenous Every 24 hours 01/22/2014 2211     02/07/14 2200  vancomycin (VANCOCIN) IVPB 1000 mg/200 mL premix  Status:  Discontinued     1,000 mg 200 mL/hr over 60 Minutes Intravenous Every 24 hours 02/07/14 0639 02/07/14 1325   02/07/14 0645  piperacillin-tazobactam (ZOSYN) IVPB 3.375 g     3.375 g 12.5 mL/hr over 240 Minutes Intravenous 3 times per day 02/07/14 0639     02/07/14 0600  ceFEPIme (MAXIPIME) 1 g in dextrose 5 % 50 mL IVPB     1 g 100 mL/hr over 30 Minutes Intravenous 3 times per day  02/12/2014 2211     02/05/2014 2145  ceFEPIme (MAXIPIME) 2 g in dextrose 5 % 50 mL IVPB     2 g 100 mL/hr over 30 Minutes Intravenous  Once 01/18/2014 2135 01/26/2014 2312   01/29/2014 2145  vancomycin (VANCOCIN) IVPB 1000 mg/200 mL premix     1,000 mg 200 mL/hr over 60 Minutes Intravenous  Once 01/27/2014 2135 02/07/14 0032          Objective:   Filed Vitals:   02/07/14 1130 02/07/14 1236 02/08/14 0000 02/08/14 0707  BP: 119/66 141/47 144/38 144/57  Pulse: 83 77 85 76  Temp:  99.1 F (37.3 C) 100 F (37.8 C) 100 F (37.8 C)  TempSrc:  Axillary Oral Oral  Resp: 22 20 16 17   Height: 5\' 1"  (1.549 m)     Weight: 61 kg (134 lb 7.7 oz)   58.8 kg (129 lb 10.1 oz)  SpO2: 98% 97% 100% 100%    Wt Readings from Last 3 Encounters:  02/08/14 58.8 kg (129 lb 10.1 oz)  11/21/12 69.4 kg (153 lb)  10/19/12 67.903 kg (149 lb 11.2 oz)     Intake/Output Summary (Last 24 hours) at 02/08/14 0939 Last data filed at 02/07/14 2158  Gross per 24 hour  Intake    620 ml  Output      0 ml  Net    620 ml     Physical Exam  Awake , pleasantly confused , No new F.N deficits, Normal affect [place].AT,PERRAL Supple Neck,No JVD, No cervical lymphadenopathy appriciated.  Symmetrical Chest wall movement, Good air movement bilaterally, rales in the right lower lobe RRR,No Gallops,Rubs , +ve systolic Murmurs, No Parasternal Heave +ve B.Sounds, Abd Soft, No tenderness, No organomegaly appriciated, No rebound - guarding or rigidity. No Cyanosis, Clubbing or edema, No new Rash or bruise, right AKA      Data Review   Micro Results Recent Results (from the past 240 hour(s))  CULTURE, BLOOD (ROUTINE X 2)     Status: None   Collection Time    02/03/2014 10:13 PM      Result Value Ref Range Status   Specimen Description BLOOD HAND RIGHT   Final   Special Requests BOTTLES DRAWN AEROBIC AND ANAEROBIC 5CC   Final   Culture  Setup Time     Final   Value: 02/07/2014 03:44     Performed at [facility]    Culture     Final   Value: GRAM POSITIVE COCCI IN CLUSTERS     Note: Gram Stain Report Called to,Read Back By and Verified With:  LORIZA WNOT @0121  ON 02/08/14 BY SMIAS     Performed at [facility]   Report Status PENDING   Incomplete  CULTURE, BLOOD (ROUTINE X 2)  Status: None   Collection Time    01/29/2014 10:21 PM      Result Value Ref Range Status   Specimen Description BLOOD FOREARM RIGHT   Final   Special Requests BOTTLES DRAWN AEROBIC AND ANAEROBIC 5CC   Final   Culture  Setup Time     Final   Value: 02/07/2014 03:44     Performed at [facility]   Culture     Final   Value: GRAM POSITIVE COCCI IN CLUSTERS     Note: Gram Stain Report Called to,Read Back By and Verified With: PATTI@8 :25AM ON 02/08/14 BY DANTS     Performed at [facility]   Report Status PENDING   Incomplete    Radiology Reports Dg Chest Portable 1 View  01/31/2014   CLINICAL DATA:  80 year old female with acute respiratory distress. Initial encounter. Personal history of left breast cancer.  EXAM: PORTABLE CHEST - 1 VIEW  COMPARISON:  12/22/2012 and earlier.  FINDINGS: Portable AP semi upright view at 2039 hrs. Improved lung volumes. Chronic left lung base hypo ventilation, retrocardiac opacity appears increased. No pneumothorax. Chronic vascular congestion or interstitial opacity is not significantly changed. Mildly increased veiling opacity at the right base. Stable cardiac size and mediastinal contours. Visualized tracheal air column is within normal limits.  IMPRESSION: Acute on chronic left lung base opacity, suspect in part due to pleural effusion, but left lower lobe aspiration or pneumonia are possible. PA and lateral views would be valuable when possible.   Electronically Signed   By: Lars Pinks M.D.   On: 02/03/2014 21:20     CBC  Recent Labs Lab 02/01/2014 2038 02/07/14 0634 02/08/14 0751  WBC 22.1* 12.8* 7.4  HGB 13.0 10.5* 10.1*  HCT 42.5 35.3* 34.1*  PLT 246 207 181    MCV 75.9* 76.9* 76.1*  MCH 23.2* 22.9* 22.5*  MCHC 30.6 29.7* 29.6*  RDW 16.2* 16.0* 16.2*    Chemistries   Recent Labs Lab 01/27/2014 2213 02/07/14 0634 02/08/14 0751  NA 138  --  144  K 3.6*  --  4.2  CL 96  --  101  CO2 31  --  37*  GLUCOSE 240*  --  238*  BUN 9  --  7  CREATININE 0.54 0.49* 0.48*  CALCIUM 8.2*  --  8.6  AST 12  --   --   ALT 7  --   --   ALKPHOS 79  --   --   BILITOT <0.2*  --   --    ------------------------------------------------------------------------------------------------------------------ estimated creatinine clearance is 46.2 ml/min (by C-G formula based on Cr of 0.48). ------------------------------------------------------------------------------------------------------------------ No results found for this basename: HGBA1C,  in the last 72 hours ------------------------------------------------------------------------------------------------------------------ No results found for this basename: CHOL, HDL, LDLCALC, TRIG, CHOLHDL, LDLDIRECT,  in the last 72 hours ------------------------------------------------------------------------------------------------------------------ No results found for this basename: TSH, T4TOTAL, FREET3, T3FREE, THYROIDAB,  in the last 72 hours ------------------------------------------------------------------------------------------------------------------ No results found for this basename: VITAMINB12, FOLATE, FERRITIN, TIBC, IRON, RETICCTPCT,  in the last 72 hours  Coagulation profile No results found for this basename: INR, PROTIME,  in the last 168 hours  No results found for this basename: DDIMER,  in the last 72 hours  Cardiac Enzymes  Recent Labs Lab 02/11/2014 2213  TROPONINI <0.30   ------------------------------------------------------------------------------------------------------------------ No components found with this basename: POCBNP,      Time Spent in minutes  35   Lala Lund K  M.D on 02/08/2014 at 9:39 AM  Between  7am to 7pm - Pager - [phone]  After 7pm go to [email or web address] - password [email or web address]  And look for the night coverage person covering for me after hours  [facility] Office  [phone]

## 2014-02-08 NOTE — Evaluation (Signed)
Clinical/Bedside Swallow Evaluation Patient Details  Name: Emily Gibbs MRN: [id number] Date of Birth: May 10, 1931  Today's Date: 02/08/2014 Time: 0272-5366 SLP Time Calculation (min): 11 min  Past Medical History:  Past Medical History  Diagnosis Date  . Diabetes mellitus without complication   . Arthritis   . Parkinson disease   . Alzheimer disease   . Dementia   . Aortic stenosis   . Heart murmur   . Cancer     Breast   . Complication of anesthesia     difficulty waking  . Acute respiratory failure with hypoxia 02/07/2014   Past Surgical History:  Past Surgical History  Procedure Laterality Date  . Abdominal hysterectomy    . Mastectomy      Left  . Femoral artery stent    . Amputation Right 10/23/2012    Procedure: AMPUTATION ABOVE KNEE;  Surgeon: Rosetta Posner, MD;  Location: [facility] OR;  Service: Vascular;  Laterality: Right;  . Eye surgery     HPI:  80 y.o. female with hx of history of advanced dementia, aortic stenosis, peripheral vascular disease status post right AKA secondary to infection, diabetes mellitus admitted with acute respiratory failure with hypoxia - most likely secondary to aspiration pneumonia less likely CHF per MD notes.  Swallow eval ordered.  Chest CT Acute on chronic left lung base opacity.  Assessment / Plan / Recommendation Clinical Impression  Pt with advanced dementia presents with functional oropharyngeal swallow.  She required hand feeding.  Pt demonstrated preserved anticipation of bolus to oral cavity; adequate masticatory effort; palpable swallow response, and no overt s/s of aspiration.  Given her advanced dementia, reliance on others for feeding, she is certainly at risk for aspiration and its consequences but appeared to eat quite functionally today.  Pt apparently going home with hospice support.  Granddaughter arrived at end of session - is aware of potential for swallowing difficulty as dementia progresses.  Recommend continued dysphagia 1  diet with thin liquids - this is home diet.  No SLP f/u needed.        Aspiration Risk  Moderate    Diet Recommendation Dysphagia 1 (Puree);Thin liquid   Liquid Administration via: Cup Medication Administration: Whole meds with puree Supervision: Staff to assist with self feeding Compensations: Slow rate;Small sips/bites Postural Changes and/or Swallow Maneuvers: Seated upright 90 degrees    Other  Recommendations Oral Care Recommendations: Oral care BID   Follow Up Recommendations  None        Swallow Study Prior Functional Status       General Date of Onset: 02/16/2014 HPI: 80 y.o. female with hx of history of advanced dementia, aortic stenosis, peripheral vascular disease status post right AKA secondary to infection, diabetes mellitus admitted with acute respiratory failure with hypoxia - most likely secondary to aspiration pneumonia less likely CHF per MD notes.  Swallow eval ordered.  Chest CT Acute on chronic left lung base opacity, suspect in part due to Type of Study: Bedside swallow evaluation Previous Swallow Assessment: none per records Diet Prior to this Study: Dysphagia 1 (puree);Thin liquids Temperature Spikes Noted: Yes Respiratory Status: Nasal cannula History of Recent Intubation: No Behavior/Cognition: Alert;Requires cueing;Doesn't follow directions Oral Cavity - Dentition: Edentulous Self-Feeding Abilities: Total assist Patient Positioning: Upright in bed Baseline Vocal Quality:  (nonverbal) Volitional Cough: Cognitively unable to elicit Volitional Swallow: Unable to elicit    Oral/Motor/Sensory Function Overall Oral Motor/Sensory Function:  (symmetry of structures; unable to f/c)   Ice Chips Ice chips: Not  tested   Thin Liquid Thin Liquid: Within functional limits Presentation: Cup    Nectar Thick Nectar Thick Liquid: Not tested   Honey Thick Honey Thick Liquid: Not tested   Puree Puree: Within functional limits Presentation: [place]. [place], [place] CCC/SLP Pager [phone]     Solid: Not tested       [name] 02/08/2014,10:18 AM

## 2014-02-08 NOTE — Care Management Note (Unsigned)
    Page 1 of 1   02/08/2014     5:12:18 PM CARE MANAGEMENT NOTE 02/08/2014  Patient:  Emily Gibbs,Emily Gibbs   Account Number:  [id number]  Date Initiated:  02/08/2014  Documentation initiated by:  Emily Gibbs  Subjective/Objective Assessment:   dx pna  admit- lives with daughter, Emily Gibbs phone [phone].     Action/Plan:   Anticipated DC Date:  02/08/2014   Anticipated DC Plan:  Emily Gibbs  In-house referral  Clinical Social Worker      DC Planning Services  CM consult      Choice offered to / List presented to:          Emily Gibbs, The arranged  HH-1 RN      Emily Gibbs   Status of service:  In process, will continue to follow Medicare Important Message given?  YES (If response is "NO", the following Medicare IM given date fields will be blank) Date Medicare IM given:  02/08/2014 Medicare IM given by:  Emily Gibbs Date Additional Medicare IM given:   Additional Medicare IM given by:    Discharge Disposition:    Per UR Regulation:  Reviewed for med. necessity/level of care/duration of stay  If discussed at Emily Gibbs, dates discussed:    Comments:  02/08/14 Emily Gibbs, BSN [phone] patient lives with daughter, Emily Gibbs who is the POA,  Emily Gibbs called Emily Gibbs on the phone and they decided on Emily Gibbs, referral made to Emily Gibbs, [place] notified.  Patient has a Gibbs bed and a hoyer lift at home.  Patient is on oxygen here at the Gibbs but does not have it at home. Patient will need ambulance transport at dc, will make referral to Emily Gibbs.  Per Emily Gibbs with Emily Gibbs they will need to do a face to face with patient and daughter on Monday, if patient is dc over the weekend will need to go home with Emily Gibbs services.  NCM informed MD of this information.

## 2014-02-08 NOTE — Progress Notes (Signed)
Nutrition Brief Note  Patient identified as nutrition risk due to low braden score.   Wt Readings from Last 15 Encounters:  02/08/14 129 lb 10.1 oz (58.8 kg)  11/21/12 153 lb (69.4 kg)  10/19/12 149 lb 11.2 oz (67.903 kg)  10/19/12 149 lb 11.2 oz (67.903 kg)  10/13/12 156 lb (70.761 kg)   Emily Gibbs is a 80 y.o. female with history of advanced dementia, aortic stenosis, peripheral vascular disease status post right AKA secondary to infection, diabetes mellitus was brought to the ER the patient was found to be in acute respiratory distress. Patient's daughter states that she saw her at around 7 PM and had gone out for shopping and when she came back around 8 PM patient was found to be in acute respiratory distress. EMS was called and patient was brought to the ER. Patient was placed on BiPAP and chest x-ray shows left lung opacity concerning for pleural effusion versus pneumonia. Patient's labs revealed leukocytosis and elevated BNP. Patient is still on BiPAP and the only follows minimal commands. As per patient's daughter patient has advanced dementia and is usually bed bound. Over the last one week patient has been having recurrent diarrhea but has had no diarrhea today.   Pt admitted with acute respiratory failure. She is currently receiving antibiotics for improved medical stability for discharge.  SLP evaluated and determined that pt is at high aspiration risk. Diet order and recommendations are for home diet of dysphagia 1 with thin liquids. Intake has been good since admission; PO: 50-100%. Plan is for pt to d/c home with hospice services once medically stable. Noted palliative care consult for goals of care.    Body mass index is 24.51 kg/(m^2). Patient meets criteria for normal weight range based on current BMI.   Current diet order is dysphagia 1, patient is consuming approximately 50-100% of meals at this time. Labs and medications reviewed.   No nutrition interventions warranted  at this time. If further nutrition issues arise, please consult RD.   Emily Gibbs A. Emily Gibbs, RD, LDN Pager: [phone] After hours Pager: [phone]

## 2014-02-08 NOTE — Progress Notes (Signed)
Notified by Ardelle Lesches of family request for services of [facility] after discharge. Pt is well known to [place] team (admitted to services initially 01/11/2013-recently discharged from services 12/19/2013 as pt stabilized)  Per Medicare regulations pt will require a Face to Face physician visit to determine eligibility. Writer spoke to pt's daughter [name] [phone]); [name] voiced understanding that this is an acute episode and she informs her mother is receiving antibiotics to help get her in a better position to discharge home.  [name] shared that she had taken pt to Dr Carlyle Lipa office this past Tuesday- on that visit, [name] asked Dr Felipa Eth about re-starting hospice services and he did not feel the time was right; the next day pt was hospitalized.  [name] understands [facility] will contact Dr Carlyle Lipa office for an order to evaluate for service. Daughter agreeable to [facility] contacting Dr Felipa Eth to confirm he is in agreement and arrange for a visit from [place].  Should pt discharge prior to Monday 02/11/14 -the [facility] will contact daughter [name] first thing Monday to follow-up and she is in agreement with this plan. Above information discussed with daughter, [name] and granddaughter [name] in the room and contact information for [facility] given to granddaughter [name].  Daughter aware that Probation officer will inform Neoma Laming [facility] of above and she will assist with any needs pt/family may have prior to discharge.  Writer discussed above with [facility] physician Dr Antonieta Loveless, [place] and a voice message was left for Dr Carlyle Lipa medical assistant   Vance Peper, RN MSN [facility] Liaison [phone]

## 2014-02-08 NOTE — Progress Notes (Signed)
CRITICAL VALUE ALERT  Critical value received: gram + cocci in clusters  Date of notification:  02/08/2014  Time of notification:  0122  Critical value read back:Yes.    Nurse who received alert:  Merri Ray  MD notified (1st page):  Kirby,NP  Time of first page:  0126  MD notified (2nd page):  Time of second page:  Responding MD:    Time MD responded:

## 2014-02-10 LAB — CULTURE, BLOOD (ROUTINE X 2)

## 2014-02-17 NOTE — Discharge Summary (Signed)
[facility] Death Note                                                                                          Death Note please see Last Note for all details.   [place] [id number] is a 80 y.o. female, Outpatient Primary MD for the patient is Mathews Argyle, MD  Pronounced dead by RN on 2014/03/01   @  5.24am                 Cause of death pneumonia likely aspiration , chronic aspiration, dementia.   Thurnell Lose M.D on 02/28/2014 at 10:03 AM  [facility] Office Phone -[phone]  Total clinical and documentation time for today - 15 minutes       Last Note                                                                                                Patient Demographics  Emily Gibbs, is a 80 y.o. female, DOB - Aug 03, 1931, [id number]  Admit date - 02/08/2014   Admitting Physician Thurnell Lose, MD  Outpatient Primary MD for the patient is Mathews Argyle, MD  LOS - 3   Chief Complaint  Patient presents with  . Respiratory Distress        Subjective:   Emily Gibbs today In bed appears comfortable, unable to answer questions reliably due to her advanced dementia.   Assessment & Plan    1. Acute respiratory failure with hypoxia - secondary to aspiration pneumonia with sepsis and bacteremia  . Continue present empiric antibiotics Aspiration precautions with feeding assistance, will have speech evaluate as aspiration is a possibility. Noted echogram. Clinically much improved, currently no oxygen needed. Monitor closely .   2.Diarrhea - check stool for C. difficile. Currently no further episodes of diarrhea, monitor.    3. Diabetes mellitus type 2 - on insulin sliding scale increase Lantus and add pre-meal NovoLog for better  control.  CBG (last 3)   Recent Labs  02/08/14 1205 02/08/14 1700 02/08/14 2232  GLUCAP 251* 320* 309*     4.Advanced dementia - continue home medications. Discussed with multiple family members bedside general medical treatment and minimal lab draws acceptable, if declines full comfort care.    5. Peripheral vascular disease status post right AKA, No acute issues.Continue aspirin for secondary prevention.    6.Systolic murmur - check Echo likely age related AS.    7. Chronic grade 1 diastolic CHF EF 32-35%.  Continue home dose diuretic. Compensated.     Code Status: DO NOT RESUSCITATE, overall prognosis appears poor will involve palliative care also  Family Communication: Multiple family members bedside  Disposition Plan: TBD   Procedures     Consults speech, palliative care   Medications  Scheduled Meds: . antiseptic oral rinse  7 mL Mouth Rinse BID  . aspirin EC  325 mg Oral Daily  . citalopram  15 mg Oral QHS  . divalproex  500 mg Oral QHS  . donepezil  10 mg Oral QHS  . enoxaparin (LOVENOX) injection  40 mg Subcutaneous Daily  . furosemide  40 mg Oral Daily  . Influenza vac split quadrivalent PF  0.5 mL Intramuscular Tomorrow-1000  . insulin aspart  0-5 Units Subcutaneous QHS  . insulin aspart  0-9 Units Subcutaneous TID WC  . insulin aspart  4 Units Subcutaneous TID WC  . insulin glargine  18 Units Subcutaneous BID  . memantine  10 mg Oral BID  . omega-3 acid ethyl esters  2 g Oral Daily  . pantoprazole  40 mg Oral Daily  . piperacillin-tazobactam (ZOSYN)  IV  3.375 g Intravenous 3 times per day  . simvastatin  40 mg Oral QPM  . sodium chloride  3 mL Intravenous Q12H  . vancomycin  1,000 mg Intravenous Q24H  . vitamin E  1,000 Units Oral Daily   Continuous Infusions:  PRN Meds:.acetaminophen, acetaminophen, albuterol, LORazepam, ondansetron (ZOFRAN) IV, ondansetron, oxyCODONE, traMADol  DVT Prophylaxis  Lovenox   Lab Results  Component  Value Date   PLT 181 02/08/2014    Antibiotics   Anti-infectives   Start     Dose/Rate Route Frequency Ordered Stop   02/07/14 2200  vancomycin (VANCOCIN) IVPB 1000 mg/200 mL premix     1,000 mg 200 mL/hr over 60 Minutes Intravenous Every 24 hours 02/05/2014 2211     02/07/14 2200  vancomycin (VANCOCIN) IVPB 1000 mg/200 mL premix  Status:  Discontinued     1,000 mg 200 mL/hr over 60 Minutes Intravenous Every 24 hours 02/07/14 0639 02/07/14 1325   02/07/14 0645  piperacillin-tazobactam (ZOSYN) IVPB 3.375 g     3.375 g 12.5 mL/hr over 240 Minutes Intravenous 3 times per day 02/07/14 0639     02/07/14 0600  ceFEPIme (MAXIPIME) 1 g in dextrose 5 % 50 mL IVPB  Status:  Discontinued     1 g 100 mL/hr over 30 Minutes Intravenous 3 times per day 02/05/2014 2211 02/08/14 1533   01/26/2014 2145  ceFEPIme (MAXIPIME) 2 g in dextrose 5 % 50 mL IVPB     2 g 100 mL/hr over 30 Minutes Intravenous  Once 02/16/2014 2135 02/01/2014 2312   02/12/2014 2145  vancomycin (VANCOCIN) IVPB 1000 mg/200 mL premix     1,000 mg 200 mL/hr over 60 Minutes Intravenous  Once 02/08/2014 2135 02/07/14 0032          Objective:   Filed Vitals:   02/08/14 0000 02/08/14 0707 02/08/14 1412 02/08/14 2203  BP: 144/38 144/57 165/71 156/52  Pulse: 85 76 94 89  Temp: 100 F (37.8 C) 100 F (37.8 C) 98.9 F (37.2 C) 99.4 F (37.4 C)  TempSrc: Oral Oral Oral Oral  Resp: 16 17 18 18   Height:      Weight:  58.8 kg (129 lb 10.1 oz)    SpO2: 100% 100% 99% 97%    Wt Readings from Last 3 Encounters:  02/08/14 58.8 kg (129 lb 10.1 oz)  11/21/12 69.4  kg (153 lb)  10/19/12 67.903 kg (149 lb 11.2 oz)     Intake/Output Summary (Last 24 hours) at 03-02-2014 1003 Last data filed at 2014/03/02 0000  Gross per 24 hour  Intake    652 ml  Output      0 ml  Net    652 ml     Physical Exam  Awake , pleasantly confused , No new F.N deficits, Normal affect [place].AT,PERRAL Supple Neck,No JVD, No cervical lymphadenopathy appriciated.    Symmetrical Chest wall movement, Good air movement bilaterally, rales in the right lower lobe RRR,No Gallops,Rubs , +ve systolic Murmurs, No Parasternal Heave +ve B.Sounds, Abd Soft, No tenderness, No organomegaly appriciated, No rebound - guarding or rigidity. No Cyanosis, Clubbing or edema, No new Rash or bruise, right AKA      Data Review   Micro Results Recent Results (from the past 240 hour(s))  CULTURE, BLOOD (ROUTINE X 2)     Status: None   Collection Time    01/21/2014 10:13 PM      Result Value Ref Range Status   Specimen Description BLOOD HAND RIGHT   Final   Special Requests BOTTLES DRAWN AEROBIC AND ANAEROBIC 5CC   Final   Culture  Setup Time     Final   Value: 02/07/2014 03:44     Performed at [facility]   Culture     Final   Value: GRAM POSITIVE COCCI IN CLUSTERS     Note: Gram Stain Report Called to,Read Back By and Verified With:  LORIZA WNOT @0121  ON 02/08/14 BY SMIAS     Performed at [facility]   Report Status PENDING   Incomplete  CULTURE, BLOOD (ROUTINE X 2)     Status: None   Collection Time    02/11/2014 10:21 PM      Result Value Ref Range Status   Specimen Description BLOOD FOREARM RIGHT   Final   Special Requests BOTTLES DRAWN AEROBIC AND ANAEROBIC 5CC   Final   Culture  Setup Time     Final   Value: 02/07/2014 03:44     Performed at [facility]   Culture     Final   Value: GRAM POSITIVE COCCI IN CLUSTERS     Note: Gram Stain Report Called to,Read Back By and Verified With: PATTI@8 :25AM ON 02/08/14 BY DANTS     Performed at [facility]   Report Status PENDING   Incomplete    Radiology Reports Dg Chest Portable 1 View  01/28/2014   CLINICAL DATA:  80 year old female with acute respiratory distress. Initial encounter. Personal history of left breast cancer.  EXAM: PORTABLE CHEST - 1 VIEW  COMPARISON:  12/22/2012 and earlier.  FINDINGS: Portable AP semi upright view at 2039 hrs. Improved lung volumes. Chronic left  lung base hypo ventilation, retrocardiac opacity appears increased. No pneumothorax. Chronic vascular congestion or interstitial opacity is not significantly changed. Mildly increased veiling opacity at the right base. Stable cardiac size and mediastinal contours. Visualized tracheal air column is within normal limits.  IMPRESSION: Acute on chronic left lung base opacity, suspect in part due to pleural effusion, but left lower lobe aspiration or pneumonia are possible. PA and lateral views would be valuable when possible.   Electronically Signed   By: Lars Pinks M.D.   On: 02/14/2014 21:20     CBC  Recent Labs Lab 01/18/2014 2038 02/07/14 0634 02/08/14 0751  WBC 22.1* 12.8* 7.4  HGB 13.0 10.5* 10.1*  HCT 42.5 35.3* 34.1*  PLT 246 207 181  MCV 75.9* 76.9* 76.1*  MCH 23.2* 22.9* 22.5*  MCHC 30.6 29.7* 29.6*  RDW 16.2* 16.0* 16.2*    Chemistries   Recent Labs Lab 01/19/2014 2213 02/07/14 0634 02/08/14 0751  NA 138  --  144  K 3.6*  --  4.2  CL 96  --  101  CO2 31  --  37*  GLUCOSE 240*  --  238*  BUN 9  --  7  CREATININE 0.54 0.49* 0.48*  CALCIUM 8.2*  --  8.6  AST 12  --   --   ALT 7  --   --   ALKPHOS 79  --   --   BILITOT <0.2*  --   --    ------------------------------------------------------------------------------------------------------------------ estimated creatinine clearance is 46.2 ml/min (by C-G formula based on Cr of 0.48). ------------------------------------------------------------------------------------------------------------------ No results found for this basename: HGBA1C,  in the last 72 hours ------------------------------------------------------------------------------------------------------------------ No results found for this basename: CHOL, HDL, LDLCALC, TRIG, CHOLHDL, LDLDIRECT,  in the last 72 hours ------------------------------------------------------------------------------------------------------------------ No results found for this basename:  TSH, T4TOTAL, FREET3, T3FREE, THYROIDAB,  in the last 72 hours ------------------------------------------------------------------------------------------------------------------ No results found for this basename: VITAMINB12, FOLATE, FERRITIN, TIBC, IRON, RETICCTPCT,  in the last 72 hours  Coagulation profile No results found for this basename: INR, PROTIME,  in the last 168 hours  No results found for this basename: DDIMER,  in the last 72 hours  Cardiac Enzymes  Recent Labs Lab 02/12/2014 2213  TROPONINI <0.30   ------------------------------------------------------------------------------------------------------------------ No components found with this basename: POCBNP,     Signature done today   Time Spent in minutes  15   SINGH,PRASHANT K M.D on 03/01/14 at 10:03 AM  Between 7am to 7pm - Pager - [phone]  After 7pm go to [email or web address] - password [email or web address]  And look for the night coverage person covering for me after hours  [facility] Office  [phone]

## 2014-02-17 NOTE — Progress Notes (Signed)
Pt passed away at 5:24am,verified by two RNs Regulatory affairs officer of this note & Lanny Hurst). Lynch,NP made aware, family informed. Post mortem care done. D/c'd IV. Emotional support given to pt daughter and grand daughter.

## 2014-02-17 DEATH — deceased

## 2015-04-04 IMAGING — CR DG TIBIA/FIBULA 2V*L*
4 series · 4 of 4 positions shown · non-contrast
Comparison: Ankle radiographs 07/30/2011.

CLINICAL DATA: Status post fall.  Lower leg lacerations.

LEFT TIBIA AND FIBULA - 2 VIEW

[x tib-fib ap left]
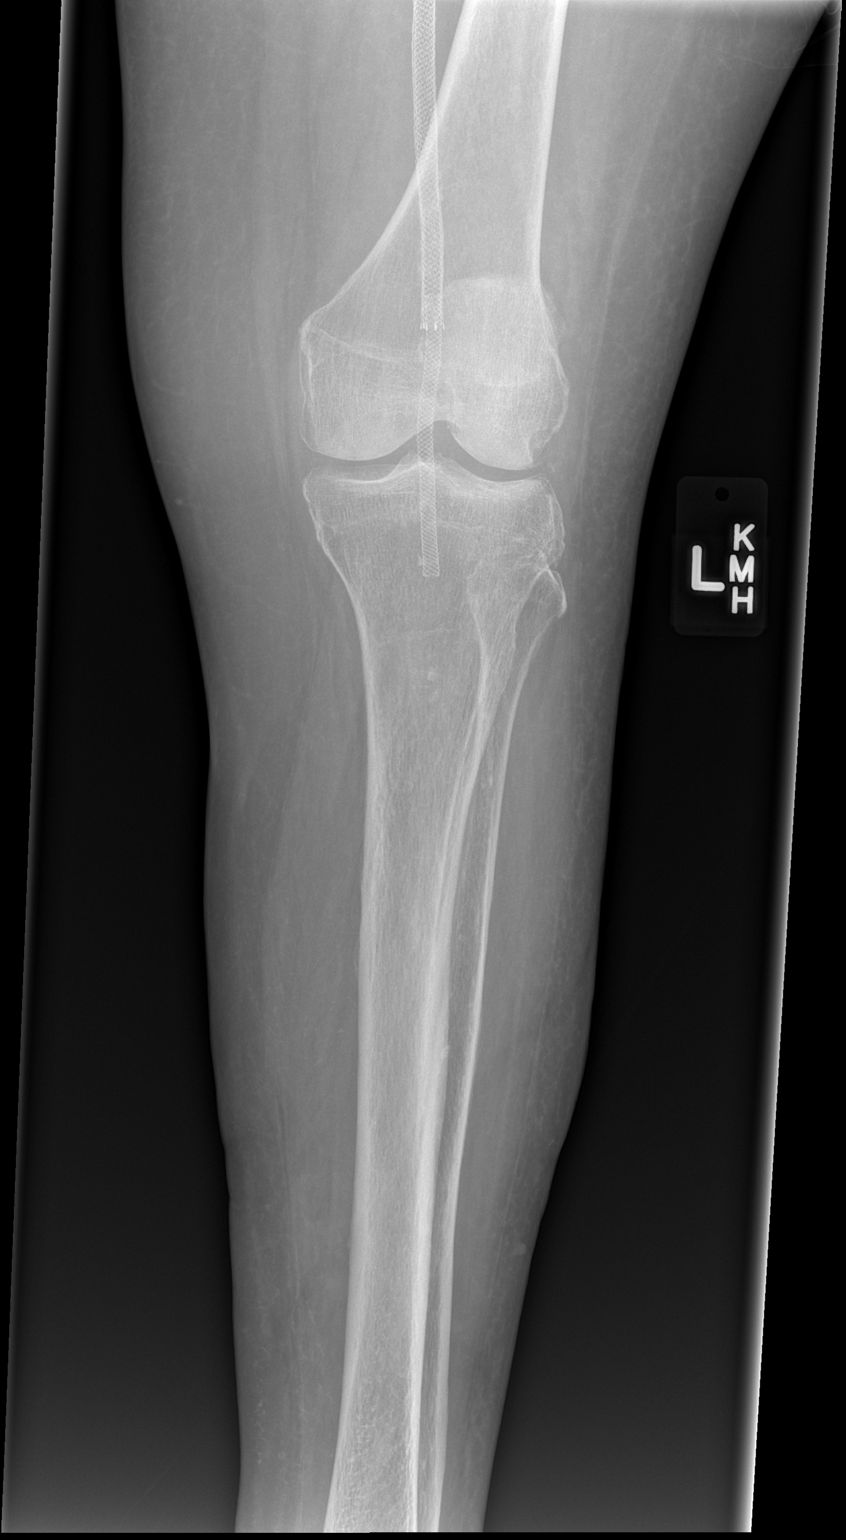

[x tib-fib lat left (1 of 3)]
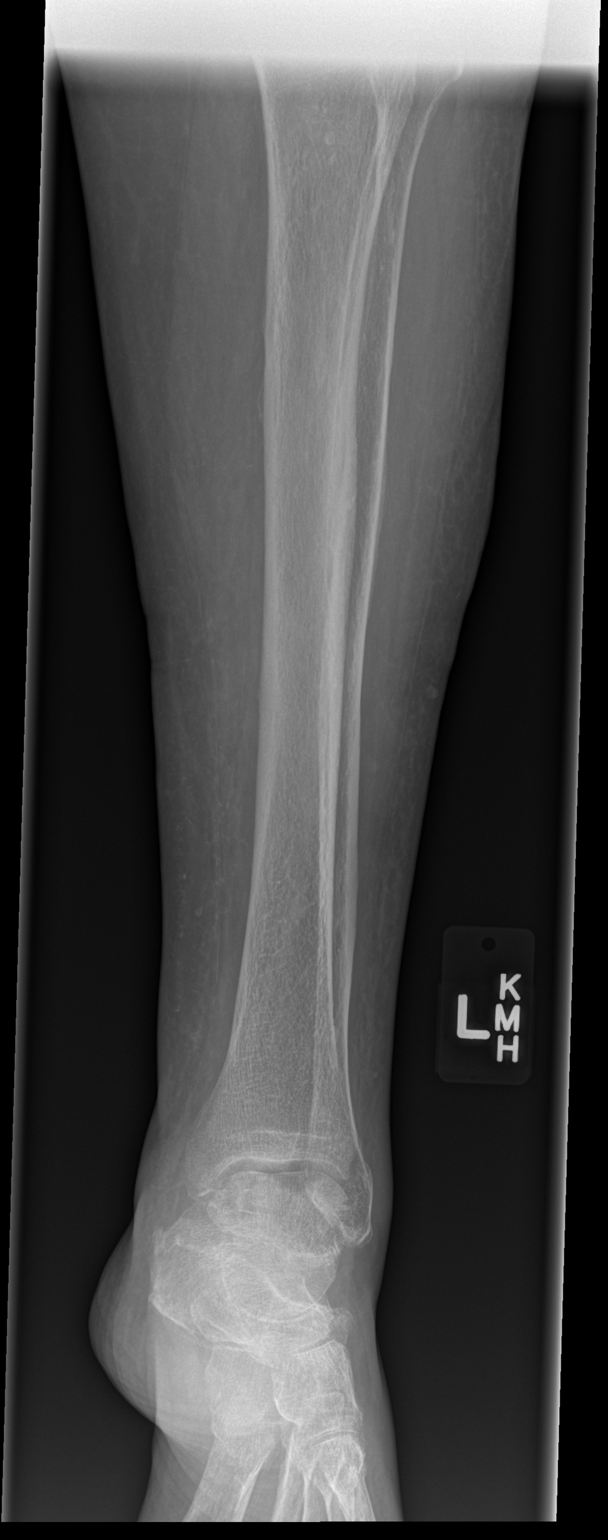

[x tib-fib lat left (2 of 3)]
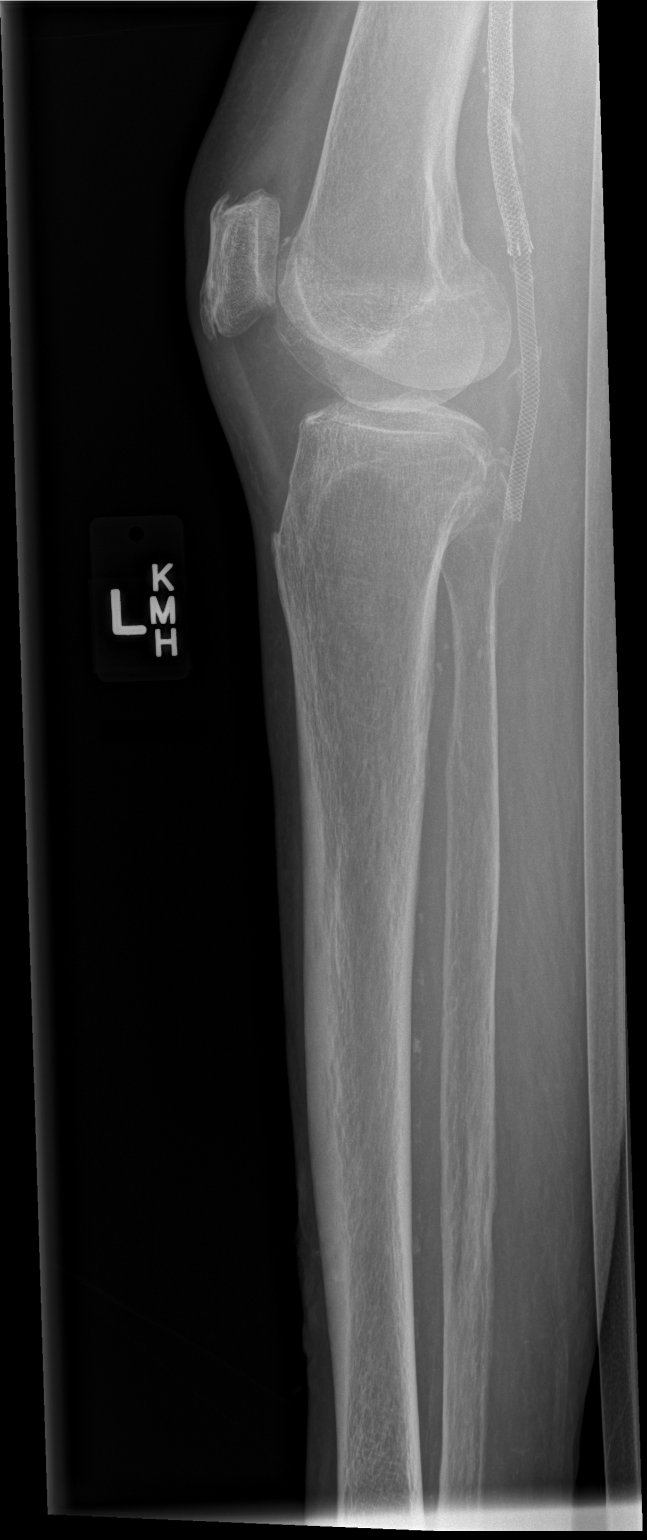

[x tib-fib lat left (3 of 3)]
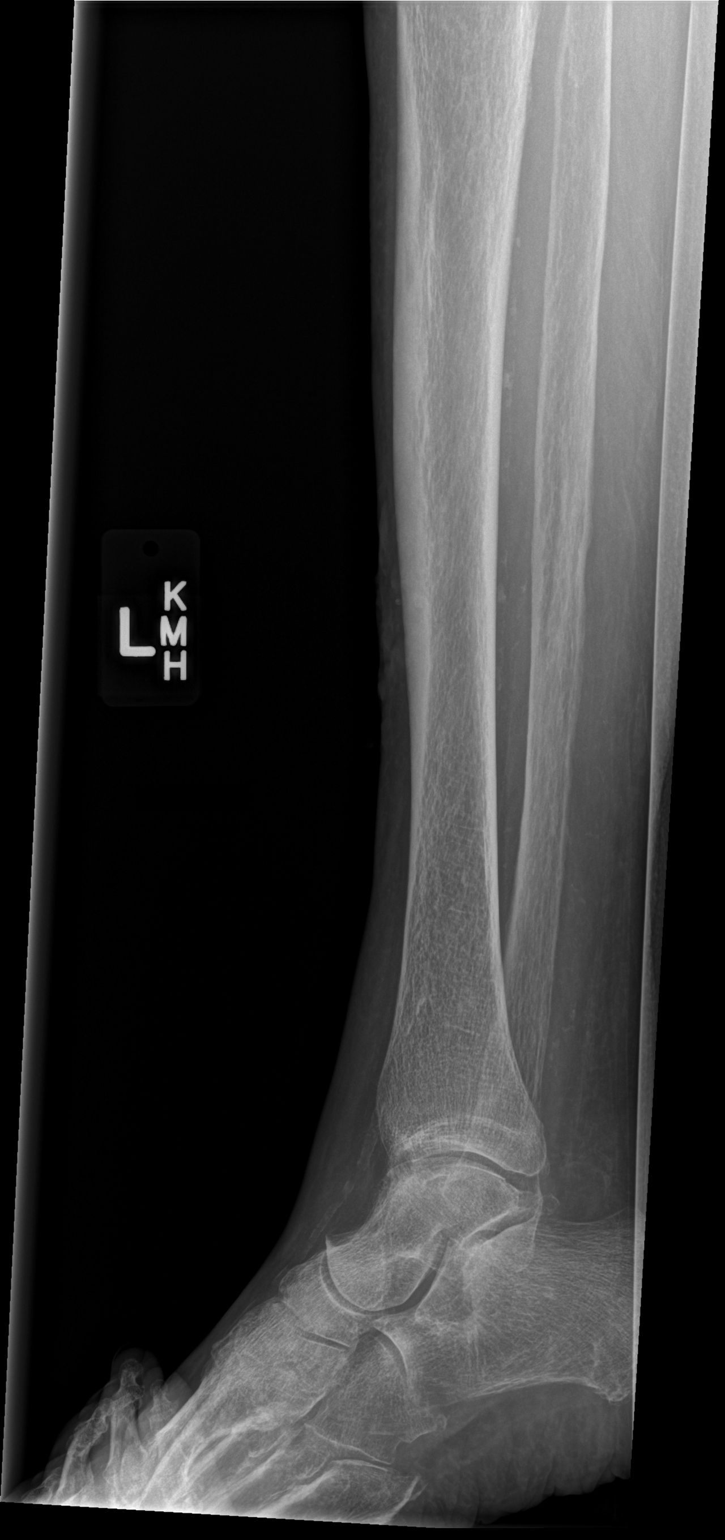

[4 of 4 positions shown; findings below may reference images not displayed]

FINDINGS: The bones are demineralized.  There is no evidence of
acute fracture, dislocation or bone destruction.  There is some
soft tissue irregularity anteriorly in the distal lower leg.  No
foreign body is apparent.  There are diffuse vascular
calcifications.  A distal femoral/popliteal stent graft is noted.
IMPRESSION: Lower leg soft tissue laceration without foreign body.  No acute
osseous findings.

## 2015-04-04 IMAGING — CR DG TIBIA/FIBULA 2V*R*
4 series · 4 of 4 positions shown · non-contrast
Comparison: None.

CLINICAL DATA: Fall today.  Lower leg lacerations.

RIGHT TIBIA AND FIBULA - 2 VIEW

[x tib-fib lat right (1 of 3)]
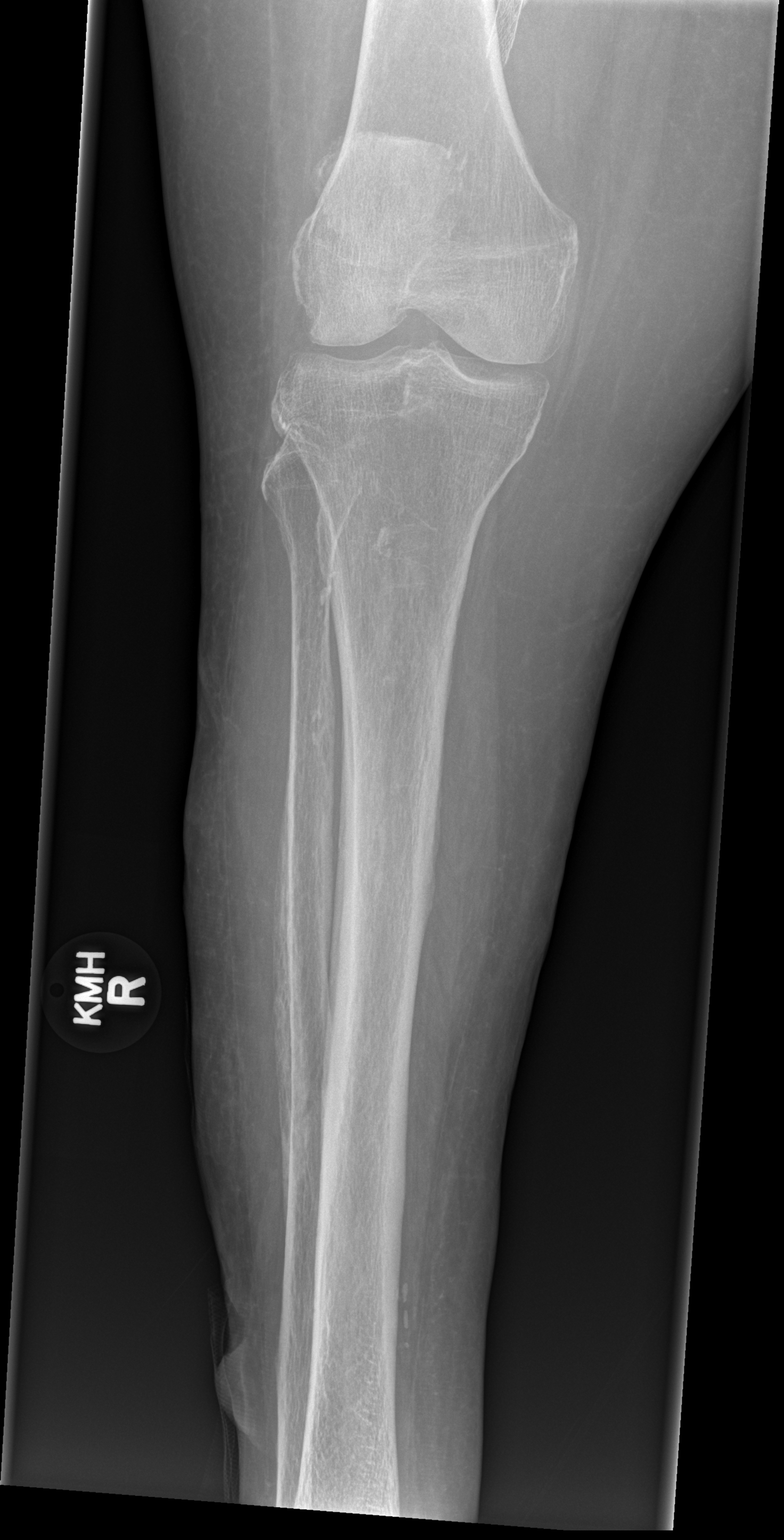

[x tib-fib ap right]
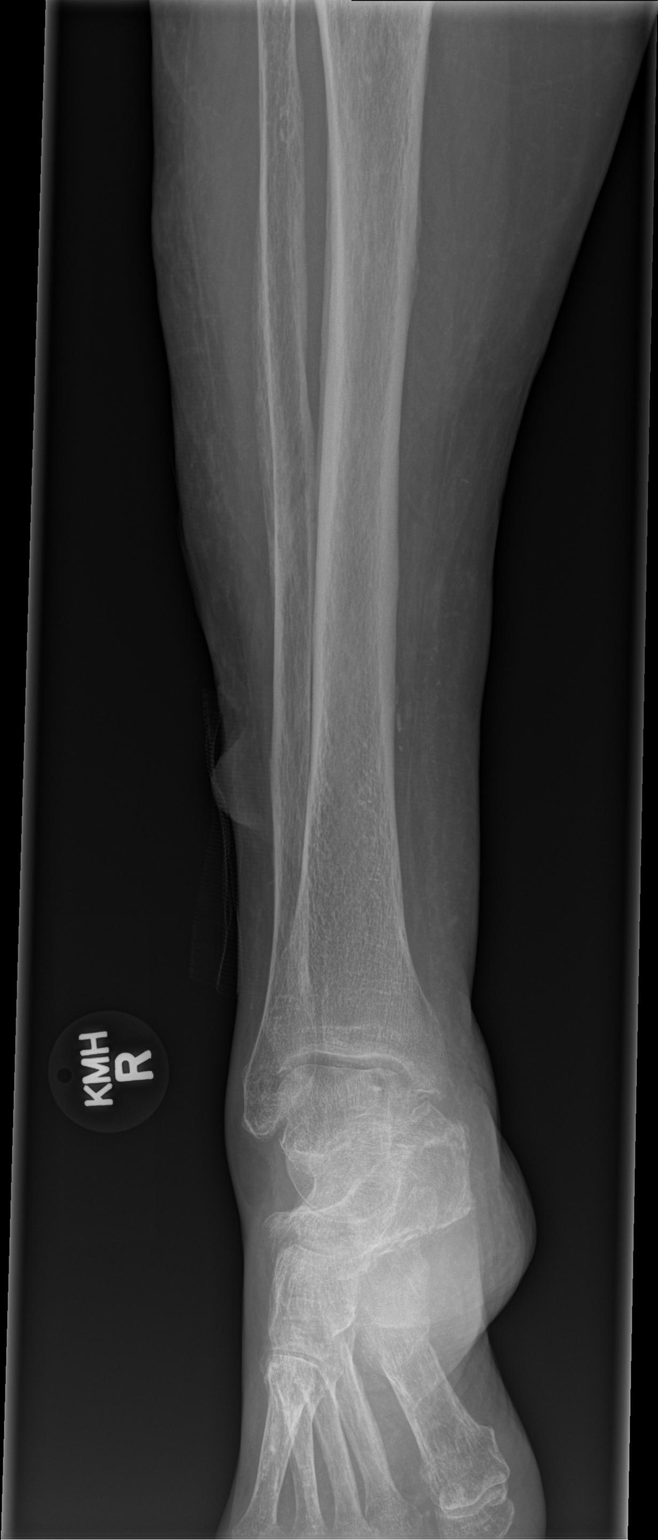

[x tib-fib lat right (2 of 3)]
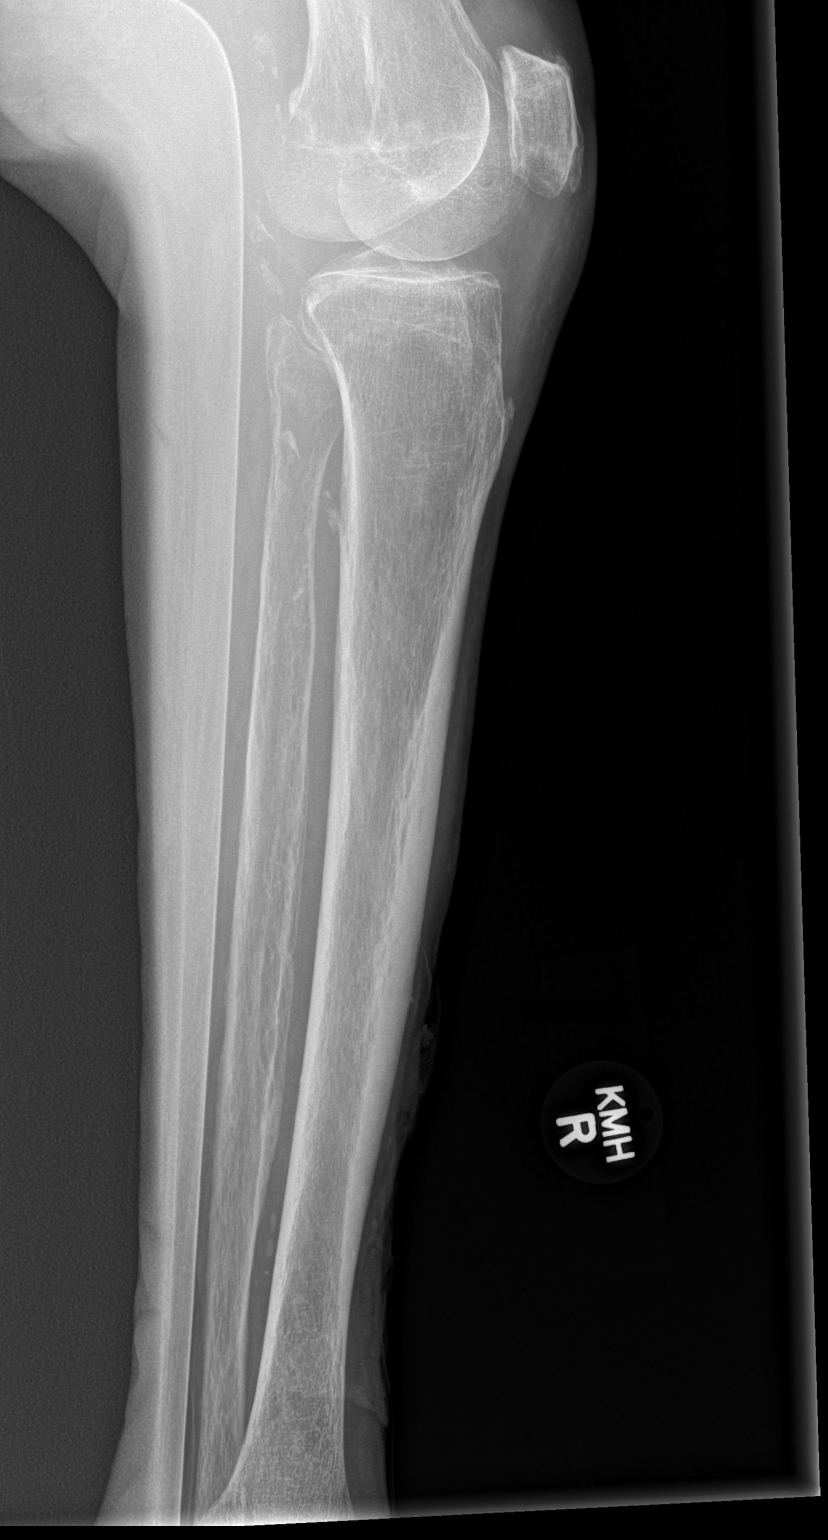

[x tib-fib lat right (3 of 3)]
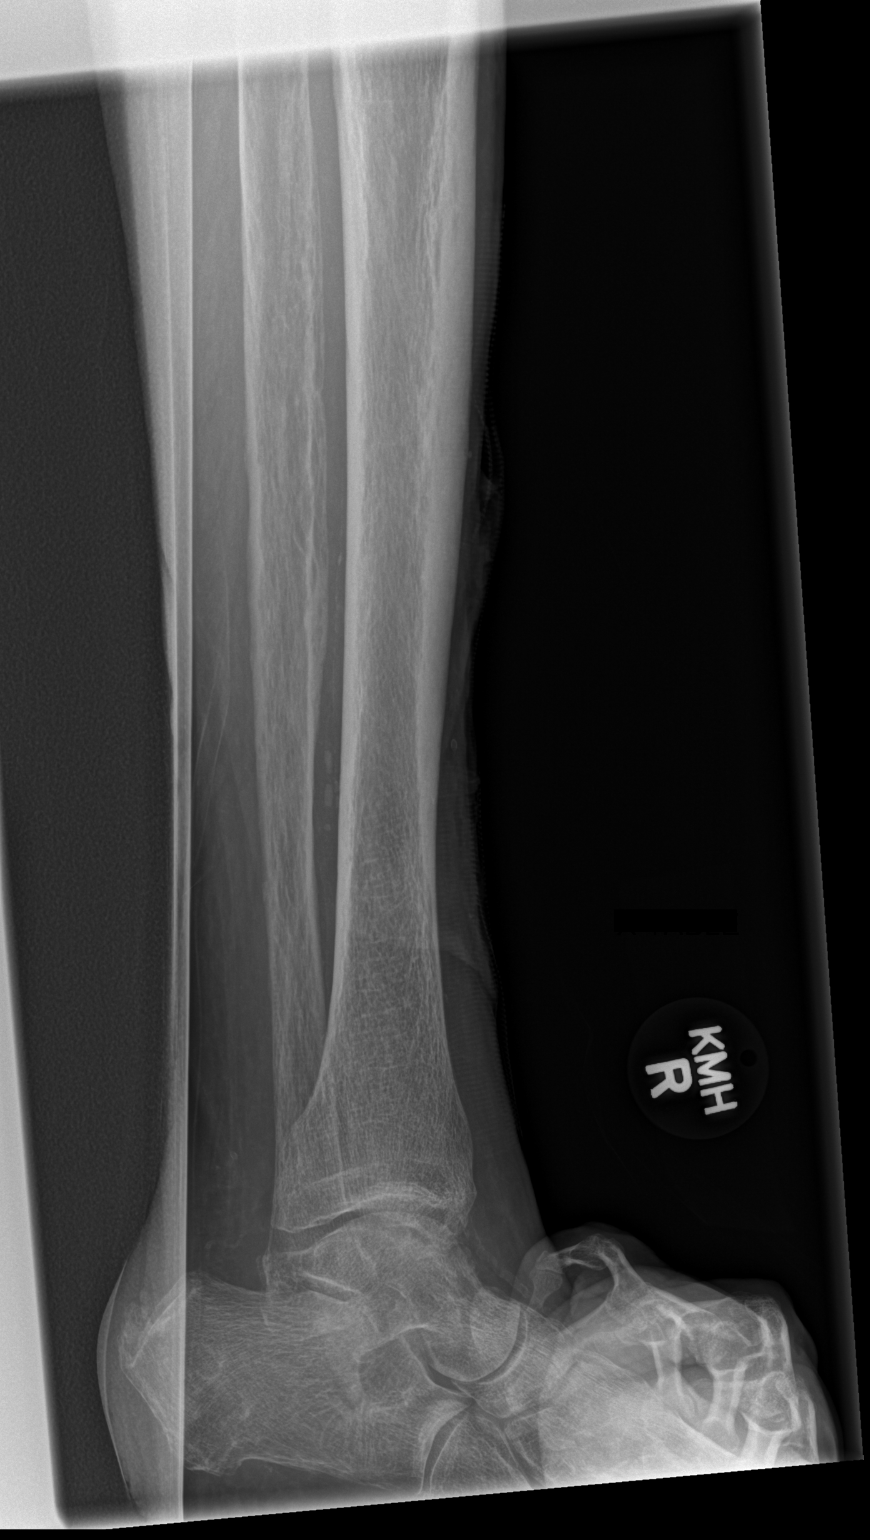

[4 of 4 positions shown; findings below may reference images not displayed]

FINDINGS: The bones are demineralized.  There is no evidence of
acute fracture, dislocation or bone destruction.  There is soft
tissue irregularity anterior to the mid tibia consistent with a
laceration.  No foreign body is identified.  Scattered vascular
calcifications are noted.  There is a stent graft in the distal
femoral artery.
IMPRESSION: Pretibial soft tissue injury.  No acute osseous findings or
evidence of foreign body.
